# Patient Record
Sex: Female | Born: 1950
Health system: Southern US, Community
[De-identification: ages and names within clinical notes are randomized; demographics above are authoritative.]

## PROBLEM LIST (undated history)

## (undated) DIAGNOSIS — E669 Obesity, unspecified: Secondary | ICD-10-CM

## (undated) DIAGNOSIS — E785 Hyperlipidemia, unspecified: Secondary | ICD-10-CM

## (undated) DIAGNOSIS — N393 Stress incontinence (female) (male): Secondary | ICD-10-CM

## (undated) HISTORY — DX: Hyperlipidemia, unspecified: E78.5

## (undated) HISTORY — PX: CATARACT EXTRACTION: SUR2

## (undated) HISTORY — DX: Obesity, unspecified: E66.9

## (undated) HISTORY — DX: Stress incontinence (female) (male): N39.3

---

## 2003-09-30 ENCOUNTER — Encounter: Admission: RE | Admit: 2003-09-30 | Discharge: 2003-09-30 | Payer: Self-pay | Admitting: Family Medicine

## 2005-03-09 ENCOUNTER — Ambulatory Visit: Payer: Self-pay | Admitting: Family Medicine

## 2005-03-14 ENCOUNTER — Ambulatory Visit: Payer: Self-pay | Admitting: Family Medicine

## 2005-03-28 ENCOUNTER — Encounter: Admission: RE | Admit: 2005-03-28 | Discharge: 2005-03-28 | Payer: Self-pay | Admitting: Family Medicine

## 2008-10-28 ENCOUNTER — Emergency Department (HOSPITAL_COMMUNITY): Admission: EM | Admit: 2008-10-28 | Discharge: 2008-10-28 | Payer: Self-pay | Admitting: Family Medicine

## 2009-01-17 ENCOUNTER — Emergency Department (HOSPITAL_COMMUNITY): Admission: EM | Admit: 2009-01-17 | Discharge: 2009-01-17 | Payer: Self-pay | Admitting: Emergency Medicine

## 2009-08-23 ENCOUNTER — Ambulatory Visit: Payer: Self-pay | Admitting: Family Medicine

## 2010-07-21 ENCOUNTER — Emergency Department (HOSPITAL_COMMUNITY)
Admission: EM | Admit: 2010-07-21 | Discharge: 2010-07-21 | Payer: Self-pay | Source: Home / Self Care | Admitting: Family Medicine

## 2010-09-05 NOTE — Assessment & Plan Note (Signed)
Summary: WGT MGT CLASS WITH DR Oasis Goehring/[facility]   

## 2013-08-01 ENCOUNTER — Emergency Department (INDEPENDENT_AMBULATORY_CARE_PROVIDER_SITE_OTHER): Payer: 59

## 2013-08-01 ENCOUNTER — Emergency Department (HOSPITAL_COMMUNITY)
Admission: EM | Admit: 2013-08-01 | Discharge: 2013-08-01 | Disposition: A | Discharge status: Home / Self Care | Payer: 59 | Source: Home / Self Care | Attending: Emergency Medicine | Admitting: Emergency Medicine

## 2013-08-01 ENCOUNTER — Encounter (HOSPITAL_COMMUNITY): Payer: Self-pay | Admitting: Emergency Medicine

## 2013-08-01 DIAGNOSIS — R059 Cough, unspecified: Secondary | ICD-10-CM

## 2013-08-01 DIAGNOSIS — K2100 Gastro-esophageal reflux disease with esophagitis, without bleeding: Secondary | ICD-10-CM

## 2013-08-01 DIAGNOSIS — J45909 Unspecified asthma, uncomplicated: Secondary | ICD-10-CM

## 2013-08-01 DIAGNOSIS — I517 Cardiomegaly: Secondary | ICD-10-CM

## 2013-08-01 DIAGNOSIS — J323 Chronic sphenoidal sinusitis: Secondary | ICD-10-CM

## 2013-08-01 DIAGNOSIS — R05 Cough: Secondary | ICD-10-CM

## 2013-08-01 MED ORDER — AMOXICILLIN-POT CLAVULANATE 875-125 MG PO TABS: 1.0000 | ORAL_TABLET | Freq: Two times a day (BID) | ORAL | Status: DC

## 2013-08-01 MED ORDER — PREDNISONE 20 MG PO TABS: ORAL_TABLET | ORAL | Status: DC

## 2013-08-01 MED ORDER — HYDROCOD POLST-CHLORPHEN POLST 10-8 MG/5ML PO LQCR: 5.0000 mL | Freq: Two times a day (BID) | ORAL | Status: DC | PRN

## 2013-08-01 MED ORDER — OMEPRAZOLE 20 MG PO CPDR: 20.0000 mg | DELAYED_RELEASE_CAPSULE | Freq: Every day | ORAL | Status: DC

## 2013-08-01 MED ORDER — BUDESONIDE-FORMOTEROL FUMARATE 160-4.5 MCG/ACT IN AERO: 2.0000 | INHALATION_SPRAY | Freq: Two times a day (BID) | RESPIRATORY_TRACT | Status: DC

## 2013-08-01 NOTE — ED Notes (Signed)
Reports sinus infection.  Patient reports being treated for sinusitis, bronchitis in November.  Was treated with biaxin, mucinex and cough medicine.  Reports feeling better, but never seemed to lose the cough. Now phelgm is thick.  Phlegm  discolored in am ( green-brown), but thick and clear the remainder of the day.  Feels like "head is in a bucket and going to pop".  Reports cough is wearing her out

## 2013-08-01 NOTE — ED Provider Notes (Signed)
Chief Complaint:   Chief Complaint  Patient presents with  . URI    History of Present Illness:   Raven Campos is a 62 year old female who's had a 2 month history of a productive cough. This began in early November. She initially went to an urgent care in [place]. [place] and was given Biaxin and cough syrup which she took for about 10 days. The cough seemed to get better and almost completely went away for a while, but then recurred. The coughing is now severe and she has paroxysms of coughing. She produces small amounts of clear, white, and brown sputum. She has had some wheezing, chest tightness, and aching in her ribs when she coughs. She has headache and sinus pressure also since the beginning of November. She has clear rhinorrhea and bilateral ear congestion. She also has had sore throat, postnasal drip, and hoarseness. She's had intermittent indigestion and heartburn. She denies any history of asthma or COPD. She is a former smoker. She quit years ago. She has no history of allergies.  Review of Systems:  Other than noted above, the patient denies any of the following symptoms: Systemic:  No fevers, chills, sweats, weight loss or gain, or fatigue. ENT:  No nasal congestion, sneezing, itching, postnasal drip, sinus pressure, headache, sore throat, or hoarseness. Lungs:  No wheezing, shortness of breath, chest tightness or congestion. Heart:  No chest pain, tightness, pressure, PND, orthopnea, or ankle edema. GI:  No indigestion, heartburn, waterbrash, burping, abdominal pain, nausea, or vomiting.  PMFSH:  Past medical history, family history, social history, meds, and allergies were reviewed.  Specifically, there is no history of asthma, allergies. Her only medications are aspirin vitamin B12.  Physical Exam:   Vital signs:  BP 134/69  Pulse 100  Temp(Src) 98.4 F (36.9 C) (Oral)  Resp 20  SpO2 100%  LMP 08/01/2013 General:  Alert and oriented.  In no distress.  Skin warm and dry. ENT: TMs  and ear canals normal.  Nasal mucosa normal, without drainage.  Pharynx clear without exudate or drainage.  No intraoral lesions. Neck:  No adenopathy, tenderness or mass.  No JVD. Lungs:  No respiratory distress.  Breath sounds clear and equal bilaterally.  No wheezes, rales or rhonchi. Heart:  Regular rhythm, no gallops or murmers.  No pedal edema. Abdomon:  Soft and nontender.  No organomegaly or mass.  Radiology:  Dg Sinuses Complete  08/01/2013   CLINICAL DATA:  Cough and congestion, cough for 2 months  EXAM: PARANASAL SINUSES - COMPLETE 3 + VIEW  COMPARISON:  None  FINDINGS: Nasal septum midline.  Paranasal sinuses clear.  Osseous mineralization normal.  On the SMV view, question tiny fluid level within the sphenoid sinus.  No other fluid levels identified.  Bones unremarkable.  IMPRESSION: Question tiny fluid level within the sphenoid sinus.  Otherwise negative exam.   Electronically Signed   By: Ulyses Southward M.D.   On: 08/01/2013 18:44   Dg Chest 2 View  08/01/2013   CLINICAL DATA:  Cough and congestion for 1 month  EXAM: CHEST  2 VIEW  COMPARISON:  None  FINDINGS: Enlargement of cardiac silhouette.  Tortuous aorta.  Mediastinal contours and pulmonary vascularity normal.  Lungs clear.  No pleural effusion or pneumothorax.  Bones unremarkable.  IMPRESSION: Enlargement of cardiac silhouette.  No acute abnormalities.   Electronically Signed   By: Ulyses Southward M.D.   On: 08/01/2013 18:39   Assessment:  The primary encounter diagnosis was Cough. Diagnoses  of Sphenoid sinusitis, Reactive airway disease, Reflux esophagitis, and Cardiomegaly were also pertinent to this visit.  I think her cough is multifactorial. Some of this due to sinusitis and postnasal drip, some to reactive airways disease, and partially to reflux esophagitis. She'll need therapy directed at all of these. Recommended she followup with her primary care physician in about 2 weeks. She also had mild cardiomegaly on chest x-ray. I am  not very impressed with this, but since it was mentioned the x-ray report, I suggested she followup with a cardiologist. Her husband sees Dr. Sherilyn Banker.  Plan:   1.  Meds:  The following meds were prescribed:   Discharge Medication List as of 08/01/2013  6:59 PM    START taking these medications   Details  amoxicillin-clavulanate (AUGMENTIN) 875-125 MG per tablet Take 1 tablet by mouth 2 (two) times daily., Starting 08/01/2013, Until Discontinued, Normal    budesonide-formoterol (SYMBICORT) 160-4.5 MCG/ACT inhaler Inhale 2 puffs into the lungs 2 (two) times daily., Starting 08/01/2013, Until Discontinued, Normal    chlorpheniramine-HYDROcodone (TUSSIONEX) 10-8 MG/5ML LQCR Take 5 mLs by mouth every 12 (twelve) hours as needed for cough., Starting 08/01/2013, Until Discontinued, Normal    omeprazole (PRILOSEC) 20 MG capsule Take 1 capsule (20 mg total) by mouth daily., Starting 08/01/2013, Until Discontinued, Normal    predniSONE (DELTASONE) 20 MG tablet 3 daily for 5 days, 2 daily for 5 days, 1 daily for 5 days., Normal        2.  Patient Education/Counseling:  The patient was given appropriate handouts, self care instructions, and instructed in symptomatic relief.  3.  Follow up:  The patient was told to follow up if no better in 3 to 4 days, if becoming worse in any way, and given some red flag symptoms such as chest pain or difficulty breathing which would prompt immediate return.  Follow up with her primary care physician in about 2 weeks and with Dr. Nadara Eaton with regard to the cardiomegaly.       Reuben Likes, MD 08/01/13 [id number]

## 2014-07-19 ENCOUNTER — Ambulatory Visit: Payer: 59 | Admitting: Dietician

## 2014-08-19 ENCOUNTER — Encounter: Payer: 59 | Attending: Family Medicine | Admitting: Dietician

## 2014-08-19 ENCOUNTER — Encounter: Payer: Self-pay | Admitting: Dietician

## 2014-08-19 VITALS — Ht 59.0 in | Wt 201.7 lb

## 2014-08-19 DIAGNOSIS — Z6841 Body Mass Index (BMI) 40.0 and over, adult: Secondary | ICD-10-CM | POA: Insufficient documentation

## 2014-08-19 DIAGNOSIS — E669 Obesity, unspecified: Secondary | ICD-10-CM | POA: Insufficient documentation

## 2014-08-19 NOTE — Progress Notes (Signed)
  Medical Nutrition Therapy:  Appt start time: 1045 end time:  1130.   Assessment:  Primary concerns today: Raven Campos states that she is here today to discuss overall healthy diet. Her husband has high TGs and prediabetes. They have recently made some dietary changes. They try to avoid red meat and use olive oil when cooking and are trying to eat more fresh fruit and frozen vegetables. Also limiting sodium. Raven Campos states she wants to learn how to read a food label.    Preferred Learning Style:   No preference indicated   Learning Readiness:   Ready  Change in progress   MEDICATIONS: see list  Usual physical activity: none  Estimated energy needs: 1500-1600 calories  Progress Towards Goal(s):  In progress.   Nutritional Diagnosis:  [place]-3.3 Overweight/obesity As related to history of excessive energy intake.  As evidenced by BMI 40.8.    Intervention:  Nutrition counseling provided. Praised patient on lifestyle changes she has made so far. Discussed low sodium seasoning options and brainstormed different meal ideas.  Teaching Method Utilized:  Visual Auditory  Handouts given during visit include:  MyPlate  15g CHO + protein snacks  Bake, broil, grill  Low sodium flavoring tips  Cholesterol and Triglycerides  Meal planning card  Barriers to learning/adherence to lifestyle change: stress  Demonstrated degree of understanding via:  Teach Back   Monitoring/Evaluation:  Dietary intake, exercise, and body weight prn.

## 2015-01-10 ENCOUNTER — Ambulatory Visit (INDEPENDENT_AMBULATORY_CARE_PROVIDER_SITE_OTHER): Payer: 59 | Admitting: Primary Care

## 2015-01-10 ENCOUNTER — Encounter: Payer: Self-pay | Admitting: Primary Care

## 2015-01-10 VITALS — BP 132/80 | HR 83 | Temp 97.8°F | Ht 60.5 in | Wt 202.8 lb

## 2015-01-10 DIAGNOSIS — Z91038 Other insect allergy status: Secondary | ICD-10-CM | POA: Diagnosis not present

## 2015-01-10 DIAGNOSIS — R200 Anesthesia of skin: Secondary | ICD-10-CM | POA: Insufficient documentation

## 2015-01-10 DIAGNOSIS — N3946 Mixed incontinence: Secondary | ICD-10-CM | POA: Diagnosis not present

## 2015-01-10 DIAGNOSIS — Z91030 Bee allergy status: Secondary | ICD-10-CM

## 2015-01-10 DIAGNOSIS — E785 Hyperlipidemia, unspecified: Secondary | ICD-10-CM | POA: Diagnosis not present

## 2015-01-10 DIAGNOSIS — R32 Unspecified urinary incontinence: Secondary | ICD-10-CM | POA: Insufficient documentation

## 2015-01-10 HISTORY — DX: Anesthesia of skin: R20.0

## 2015-01-10 MED ORDER — EPINEPHRINE 0.3 MG/0.3ML IJ SOAJ
0.3000 mg | Freq: Once | INTRAMUSCULAR | Status: DC
Start: 2015-01-10 — End: 2019-09-22

## 2015-01-10 NOTE — Assessment & Plan Note (Signed)
Mixed stress and urge. Present since abdominal weight gain years ago. Offered medication. Advised weight loss. She will work to reduce her weight at this time. Will continue to monitor.

## 2015-01-10 NOTE — Assessment & Plan Note (Addendum)
Prior history. Once taking Fish Oil but has not since 2009. Will check lipids with upcoming physical. She recently met with a dietician who provided education regarding diet. Encouraged her new eating habits and instructed to start exercising.

## 2015-01-10 NOTE — Progress Notes (Signed)
Subjective:    Patient ID: Raven Campos, female    DOB: Oct 25, 1950, 64 y.o.   MRN: [id number]  HPI  Raven Campos is a 64 year old female who presents today who presents today to establish care and discuss the problems mentioned below.   1) Hyperlipidemia: Diagnosed several years ago and took [employer] for years. She was never placed on medication and has been off of Fish Oil since 2009. She presented to the dietician through [facility] early this year for consultation. Her new diet consists of grilled, broiled lean meats, vegetables, limited starchy foods. She drinks mostly water, coffee, and [facility]. She is a stress eater and will eat large portions at times. She is currently not exercising.  2) Urine Incontinence: Stress incontinence. Present since abdominal weight gain over the past several years. History of frequent UTI's as a child. Occurs daily and wears a pad throughout the day. She is   3) Numbness: Present to fingers for the past year intermittently. She will notice it mostly when sitting or driving in the car. When standing up straight and improving her posture her numbness will improve.  Review of Systems  Constitutional: Negative for unexpected weight change.  HENT: Negative for rhinorrhea.   Respiratory: Negative for cough and shortness of breath.   Cardiovascular: Negative for chest pain.  Gastrointestinal: Negative for diarrhea and constipation.  Genitourinary:       Stress incontinence   Skin: Negative for rash.  Allergic/Immunologic: Positive for environmental allergies.  Neurological: Negative for dizziness and headaches.  Psychiatric/Behavioral:       Denies concerns for anxiety or depression.       Past Medical History  Diagnosis Date  . Obesity   . Hyperlipidemia   . Stress incontinence     History   Social History  . Marital Status: Married    Spouse Name: N/A  . Number of Children: N/A  . Years of Education: N/A   Occupational History  .  Not on file.   Social History Main Topics  . Smoking status: Never Smoker   . Smokeless tobacco: Not on file  . Alcohol Use: 0.0 oz/week    0 Standard drinks or equivalent per week     Comment: occ  . Drug Use: No  . Sexual Activity: Not on file   Other Topics Concern  . Not on file   Social History Narrative   Married.   Highest level of education: Associates degree in Nursing.   Works at [employer].   Enjoys gardening, yard work, sewing, working puzzles.    No past surgical history on file.  Family History  Problem Relation Age of Onset  . Cancer Other   . Obesity Other   . Hypertension Other   . Arthritis Mother   . Hyperlipidemia Mother   . Hypertension Mother   . Cancer Father     lung    Allergies  Allergen Reactions  . Bee Venom     Current Outpatient Prescriptions on File Prior to Visit  Medication Sig Dispense Refill  . Cyanocobalamin (VITAMIN B 12 PO) Take by mouth.     No current facility-administered medications on file prior to visit.    BP 132/80 mmHg  Pulse 83  Temp(Src) 97.8 F (36.6 C) (Oral)  Ht 5' 0.5" (1.537 m)  Wt 202 lb 12.8 oz (91.989 kg)  BMI 38.94 kg/m2  SpO2 95%  LMP 08/01/2013    Objective:  Physical Exam  Constitutional: She is oriented to person, place, and time. She appears well-nourished.  Cardiovascular: Normal rate and regular rhythm.   Pulmonary/Chest: Effort normal and breath sounds normal.  Musculoskeletal: Normal range of motion.  Negative phalens and tinnels sign.  Neurological: She is alert and oriented to person, place, and time.  Skin: Skin is warm and dry.  Psychiatric: She has a normal mood and affect.          Assessment & Plan:

## 2015-01-10 NOTE — Progress Notes (Signed)
Pre visit review using our clinic review tool, if applicable. No additional management support is needed unless otherwise documented below in the visit note. 

## 2015-01-10 NOTE — Patient Instructions (Signed)
I have sent you an Epi Pen with refills to your pharmacy. Please schedule a physical with me in the next 1-2 months. You will also schedule a lab only appointment one week prior. We will discuss your lab results during your physical. It was a pleasure to meet you today! Please don't hesitate to call me with any questions. Welcome to [facility]!

## 2015-01-10 NOTE — Assessment & Plan Note (Signed)
Present intermittently to fingers only. Improved with correct posture.  Will continue to monitor.

## 2015-01-11 ENCOUNTER — Other Ambulatory Visit: Payer: Self-pay

## 2015-01-11 DIAGNOSIS — Z1231 Encounter for screening mammogram for malignant neoplasm of breast: Secondary | ICD-10-CM

## 2015-02-01 ENCOUNTER — Ambulatory Visit
Admission: RE | Admit: 2015-02-01 | Discharge: 2015-02-01 | Disposition: A | Discharge status: Home / Self Care | Payer: 59 | Source: Ambulatory Visit

## 2015-02-01 DIAGNOSIS — Z1231 Encounter for screening mammogram for malignant neoplasm of breast: Secondary | ICD-10-CM

## 2015-02-08 LAB — HM MAMMOGRAPHY: HM Mammogram: NORMAL

## 2015-02-09 ENCOUNTER — Encounter: Payer: Self-pay | Admitting: *Deleted

## 2015-02-17 ENCOUNTER — Other Ambulatory Visit: Payer: Self-pay | Admitting: Primary Care

## 2015-02-17 DIAGNOSIS — Z0000 Encounter for general adult medical examination without abnormal findings: Secondary | ICD-10-CM

## 2015-02-17 DIAGNOSIS — E785 Hyperlipidemia, unspecified: Secondary | ICD-10-CM

## 2015-02-22 ENCOUNTER — Other Ambulatory Visit (INDEPENDENT_AMBULATORY_CARE_PROVIDER_SITE_OTHER): Payer: 59

## 2015-02-22 DIAGNOSIS — Z0000 Encounter for general adult medical examination without abnormal findings: Secondary | ICD-10-CM

## 2015-02-22 DIAGNOSIS — E785 Hyperlipidemia, unspecified: Secondary | ICD-10-CM | POA: Diagnosis not present

## 2015-02-22 LAB — LIPID PANEL
Cholesterol: 206 mg/dL — ABNORMAL HIGH (ref 0–200)
HDL: 47.1 mg/dL (ref >39.00)
LDL Cholesterol: 132 mg/dL — ABNORMAL HIGH (ref 0–99)
NONHDL: 158.9
TRIGLYCERIDES: 136 mg/dL (ref 0.0–149.0)
Total CHOL/HDL Ratio: 4
VLDL: 27.2 mg/dL (ref 0.0–40.0)

## 2015-02-22 LAB — COMPREHENSIVE METABOLIC PANEL
ALBUMIN: 4 g/dL (ref 3.5–5.2)
ALT: 31 U/L (ref 0–35)
AST: 23 U/L (ref 0–37)
Alkaline Phosphatase: 58 U/L (ref 39–117)
BILIRUBIN TOTAL: 0.5 mg/dL (ref 0.2–1.2)
BUN: 19 mg/dL (ref 6–23)
CHLORIDE: 106 meq/L (ref 96–112)
CO2: 29 meq/L (ref 19–32)
CREATININE: 0.65 mg/dL (ref 0.40–1.20)
Calcium: 9.1 mg/dL (ref 8.4–10.5)
GFR: 97.9 mL/min (ref >60.00)
GLUCOSE: 94 mg/dL (ref 70–99)
Potassium: 4.3 mEq/L (ref 3.5–5.1)
Sodium: 142 mEq/L (ref 135–145)
TOTAL PROTEIN: 6.7 g/dL (ref 6.0–8.3)

## 2015-02-22 LAB — CBC
HEMATOCRIT: 42.9 % (ref 36.0–46.0)
HEMOGLOBIN: 14.6 g/dL (ref 12.0–15.0)
MCHC: 34 g/dL (ref 30.0–36.0)
MCV: 89.6 fl (ref 78.0–100.0)
PLATELETS: 210 10*3/uL (ref 150.0–400.0)
RBC: 4.79 Mil/uL (ref 3.87–5.11)
RDW: 13.1 % (ref 11.5–15.5)
WBC: 5.8 10*3/uL (ref 4.0–10.5)

## 2015-02-22 LAB — TSH: TSH: 1.54 u[IU]/mL (ref 0.35–4.50)

## 2015-02-22 LAB — VITAMIN D 25 HYDROXY (VIT D DEFICIENCY, FRACTURES): VITD: 17.62 ng/mL — ABNORMAL LOW (ref 30.00–100.00)

## 2015-02-22 LAB — HEMOGLOBIN A1C: HEMOGLOBIN A1C: 5.7 % (ref 4.6–6.5)

## 2015-03-01 ENCOUNTER — Ambulatory Visit (INDEPENDENT_AMBULATORY_CARE_PROVIDER_SITE_OTHER): Payer: 59 | Admitting: Primary Care

## 2015-03-01 ENCOUNTER — Other Ambulatory Visit (HOSPITAL_COMMUNITY)
Admission: RE | Admit: 2015-03-01 | Discharge: 2015-03-01 | Disposition: A | Discharge status: Home / Self Care | Payer: 59 | Source: Ambulatory Visit | Attending: Primary Care | Admitting: Primary Care

## 2015-03-01 ENCOUNTER — Encounter: Payer: Self-pay | Admitting: Primary Care

## 2015-03-01 VITALS — BP 120/86 | HR 65 | Temp 97.5°F | Ht 62.0 in | Wt 200.8 lb

## 2015-03-01 DIAGNOSIS — Z124 Encounter for screening for malignant neoplasm of cervix: Secondary | ICD-10-CM | POA: Diagnosis not present

## 2015-03-01 DIAGNOSIS — Z0000 Encounter for general adult medical examination without abnormal findings: Secondary | ICD-10-CM | POA: Insufficient documentation

## 2015-03-01 DIAGNOSIS — E559 Vitamin D deficiency, unspecified: Secondary | ICD-10-CM | POA: Diagnosis not present

## 2015-03-01 DIAGNOSIS — Z1151 Encounter for screening for human papillomavirus (HPV): Secondary | ICD-10-CM | POA: Insufficient documentation

## 2015-03-01 DIAGNOSIS — Z01419 Encounter for gynecological examination (general) (routine) without abnormal findings: Secondary | ICD-10-CM | POA: Diagnosis not present

## 2015-03-01 DIAGNOSIS — Z23 Encounter for immunization: Secondary | ICD-10-CM | POA: Diagnosis not present

## 2015-03-01 DIAGNOSIS — E785 Hyperlipidemia, unspecified: Secondary | ICD-10-CM

## 2015-03-01 MED ORDER — VITAMIN D (ERGOCALCIFEROL) 1.25 MG (50000 UNIT) PO CAPS: ORAL_CAPSULE | ORAL | Status: DC

## 2015-03-01 NOTE — Patient Instructions (Addendum)
You will be contacted regarding [facility] .  Please let us know if you have not heard back within one week.   Start Vitamin D 50,0000 unit capsules. Take 1 capsule by mouth once weekly for a total of 12 weeks.  Work to [employer] and start exercising.  Follow up in 6 months for re-evaluation of your cholesterol and diabetes test.  It was nice to see you!

## 2015-03-01 NOTE — Assessment & Plan Note (Signed)
Vitamin D level at 17. Initiated 50,000 unit dose to take 1 weekly. Bone density testing ordered. Will recheck in 12 weeks.

## 2015-03-01 NOTE — Progress Notes (Signed)
Subjective:    Patient ID: Raven Campos, female    DOB: May 19, 1951, 64 y.o.   MRN: [id number]  HPI  Raven Campos is a 64 year old female who presents today for complete physical.  Immunizations: -Tetanus: Tetanus Booster was completed at [facility]. -Influenza: Did receive last season. -Shingles: Has never completed. Due.   Diet: Fair diet. Breakfast: Oatmeal, special K cereal, cherrios. Occasional Ham biscuit. Lunch: Salad, chicken, pasta, left over dinner, peanut butter cracker Dinner: Red meat, chicken, fish, pork chops, sweet potatoes, vegetables from her garden Desserts: Rare, chocolate chip cookies. Beverages: Drinking mostly soft drinks, some water. Exercise: She is not exercising regularly. Active around the house.  Eye exam: Completes every 2 years. Completed last July, wears glasses to drive. Dental exam: Due in September this year. Completes every 6 months. Colonoscopy: Never completed, she is not interested in an actual colonoscopy but is interested in the FOBT kit. Dexa: Last completed several years ago. Pap Smear: Has not completed in over 10 years. Mammogram: Completed mammogram July 2016.   Review of Systems  Constitutional: Negative for unexpected weight change.  HENT: Negative for rhinorrhea.   Respiratory: Negative for cough and shortness of breath.   Cardiovascular: Negative for chest pain.  Gastrointestinal: Negative for abdominal pain, diarrhea and constipation.  Genitourinary: Negative for difficulty urinating.       Urinary frequency. Wears incontinence pad for some stress incontinence. Has had urological testing, no abnormalities.   Musculoskeletal: Negative for myalgias and arthralgias.  Skin: Negative for rash.  Neurological: Negative for dizziness, numbness and headaches.  Psychiatric/Behavioral:       Denies concerns for anxiety and depression.       Past Medical History  Diagnosis Date  . Obesity   . Hyperlipidemia   . Stress incontinence      History   Social History  . Marital Status: Married    Spouse Name: N/A  . Number of Children: N/A  . Years of Education: N/A   Occupational History  . Not on file.   Social History Main Topics  . Smoking status: Never Smoker   . Smokeless tobacco: Not on file  . Alcohol Use: 0.0 oz/week    0 Standard drinks or equivalent per week     Comment: occ  . Drug Use: No  . Sexual Activity: Not on file   Other Topics Concern  . Not on file   Social History Narrative   Married.   Highest level of education: Associates degree in Nursing.   Works at [employer].   Enjoys gardening, yard work, sewing, working puzzles.    No past surgical history on file.  Family History  Problem Relation Age of Onset  . Cancer Other   . Obesity Other   . Hypertension Other   . Arthritis Mother   . Hyperlipidemia Mother   . Hypertension Mother   . Cancer Father     lung    Allergies  Allergen Reactions  . Bee Venom     Current Outpatient Prescriptions on File Prior to Visit  Medication Sig Dispense Refill  . aspirin 81 MG tablet Take 81 mg by mouth daily.    . Cyanocobalamin (VITAMIN B 12 PO) Take by mouth.    . EPINEPHrine (EPIPEN 2-PAK) 0.3 mg/0.3 mL IJ SOAJ injection Inject 0.3 mLs (0.3 mg total) into the muscle once. (Patient not taking: Reported on 03/01/2015) 1 Device 1   No current facility-administered medications on file  prior to visit.    BP 120/86 mmHg  Pulse 65  Temp(Src) 97.5 F (36.4 C) (Oral)  Ht '5\' 2"'  (1.575 m)  Wt 200 lb 12.8 oz (91.082 kg)  BMI 36.72 kg/m2  SpO2 97%  LMP 08/01/2013     Objective:   Physical Exam  Constitutional: She is oriented to person, place, and time. She appears well-nourished.  HENT:  Right Ear: Tympanic membrane and ear canal normal.  Left Ear: Tympanic membrane and ear canal normal.  Nose: Nose normal.  Mouth/Throat: Oropharynx is clear and moist.  Eyes: Conjunctivae and EOM are normal. Pupils are  equal, round, and reactive to light.  Neck: Neck supple. No thyromegaly present.  Cardiovascular: Normal rate and regular rhythm.   Pulmonary/Chest: Effort normal and breath sounds normal.  Abdominal: Soft. Bowel sounds are normal. There is no tenderness.  Genitourinary: Vagina normal. Cervix exhibits no motion tenderness and no discharge. Right adnexum displays no tenderness. Left adnexum displays no tenderness.  Musculoskeletal: Normal range of motion.  Lymphadenopathy:    She has no cervical adenopathy.  Neurological: She is alert and oriented to person, place, and time. She has normal reflexes. No cranial nerve deficit.  Skin: Skin is warm and dry.  Psychiatric: She has a normal mood and affect.          Assessment & Plan:

## 2015-03-01 NOTE — Assessment & Plan Note (Addendum)
Due for shingles. Tetanus up to date. Pap due and completed today. Will order bone density screening. Labs mostly unremarkable with exception to slightly above goal cholesterol and near borderline diabetes. Discussed healthy diet and exercise. Will recheck in 6 months. Exam unremarkable.

## 2015-03-01 NOTE — Assessment & Plan Note (Signed)
Slightly above goal. She will work hard on diet and exercise. Recheck in 6 months.

## 2015-03-01 NOTE — Progress Notes (Signed)
Pre visit review using our clinic review tool, if applicable. No additional management support is needed unless otherwise documented below in the visit note. 

## 2015-03-02 LAB — CYTOLOGY - PAP

## 2015-03-03 ENCOUNTER — Encounter: Payer: Self-pay | Admitting: *Deleted

## 2015-03-15 ENCOUNTER — Ambulatory Visit
Admission: RE | Admit: 2015-03-15 | Discharge: 2015-03-15 | Disposition: A | Discharge status: Home / Self Care | Payer: 59 | Source: Ambulatory Visit | Attending: Primary Care | Admitting: Primary Care

## 2015-03-15 DIAGNOSIS — E559 Vitamin D deficiency, unspecified: Secondary | ICD-10-CM

## 2015-05-27 ENCOUNTER — Other Ambulatory Visit (INDEPENDENT_AMBULATORY_CARE_PROVIDER_SITE_OTHER): Payer: 59

## 2015-05-27 DIAGNOSIS — E559 Vitamin D deficiency, unspecified: Secondary | ICD-10-CM

## 2015-05-27 LAB — VITAMIN D 25 HYDROXY (VIT D DEFICIENCY, FRACTURES): VITD: 33.3 ng/mL (ref 30.00–100.00)

## 2015-06-26 ENCOUNTER — Other Ambulatory Visit: Payer: Self-pay | Admitting: Primary Care

## 2015-09-01 ENCOUNTER — Encounter: Payer: Self-pay | Admitting: Primary Care

## 2015-09-01 ENCOUNTER — Ambulatory Visit (INDEPENDENT_AMBULATORY_CARE_PROVIDER_SITE_OTHER): Payer: 59 | Admitting: Primary Care

## 2015-09-01 VITALS — BP 118/78 | HR 64 | Temp 98.1°F | Ht 62.0 in | Wt 200.8 lb

## 2015-09-01 DIAGNOSIS — E785 Hyperlipidemia, unspecified: Secondary | ICD-10-CM | POA: Diagnosis not present

## 2015-09-01 DIAGNOSIS — E559 Vitamin D deficiency, unspecified: Secondary | ICD-10-CM

## 2015-09-01 DIAGNOSIS — Z1211 Encounter for screening for malignant neoplasm of colon: Secondary | ICD-10-CM

## 2015-09-01 LAB — VITAMIN D 25 HYDROXY (VIT D DEFICIENCY, FRACTURES): VITD: 17.54 ng/mL — AB (ref 30.00–100.00)

## 2015-09-01 NOTE — Addendum Note (Signed)
Addended by: Kilian Schwartz J on: 07/11/2016 10:35 AM   Modules accepted: Orders  

## 2015-09-01 NOTE — Progress Notes (Signed)
Pre visit review using our clinic review tool, if applicable. No additional management support is needed unless otherwise documented below in the visit note. 

## 2015-09-01 NOTE — Assessment & Plan Note (Signed)
Recent level improved to 33 from 17. Currently managed on 2000 units daily. Continue same, vitamin D level pending.

## 2015-09-01 NOTE — Patient Instructions (Signed)
Complete lab work prior to leaving today. I will notify you of your results once received.   Pick up your stool kit from the lab.  Follow up in late July or early August 2017 for your annual physical. Ensure to schedule a lab appointment 3-4 days prior.  Continue your efforts towards a healthy lifestyle.  It was a pleasure to see you today!

## 2015-09-01 NOTE — Progress Notes (Signed)
Subjective:    Patient ID: Raven Campos, female    DOB: [birth year], 65 y.o.   MRN: [id number]  HPI  Raven Campos is a 65 year old female who presents today for follow up.   1) Hyperlipidemia: TC of 206 and LDL of 132 during labs in July 2016. She's been working to make changes in her diet since her last visit. Decreased mountain dew consumption, cut out sweet tea, eating little to no fried foods.   Her diet currently consists of: Breakfast: Oatmeal, cereal, 2% milk, occasional ham biscuit and omlette Lunch: Home packed, soup, fruit, peanut butter sandwich, salad, flat bread sub sandwich. Dinner: Home cooked meals. (fresh vegetables, lean meats-chicken, pork, fish)  Snacks: Peanut butter and crackers, fruit Desserts: Occasional candy bar or cookie Beverages: Water, down to 2 mountain dew's per day, no sweet tea since summer 2016.  Exercise: She is not exercising, but plans on starting.  Wt Readings from Last 3 Encounters:  09/01/15 200 lb 12.8 oz (91.082 kg)  03/01/15 200 lb 12.8 oz (91.082 kg)  01/10/15 202 lb 12.8 oz (91.989 kg)     2) Vitamin D Deficiency: Original level of 17.6 in July 2016, placed on 50,000 units weekly x 3 months, repeat level of 33 in October. She was to transition to 2000 units daily. She has been taking 2000 units daily since October.   Review of Systems  Respiratory: Negative for shortness of breath.   Cardiovascular: Negative for chest pain.  Musculoskeletal: Negative for myalgias.  Neurological: Negative for headaches.       Past Medical History  Diagnosis Date  . Obesity   . Hyperlipidemia   . Stress incontinence     Social History   Social History  . Marital Status: Married    Spouse Name: N/A  . Number of Children: N/A  . Years of Education: N/A   Occupational History  . Not on file.   Social History Main Topics  . Smoking status: Never Smoker   . Smokeless tobacco: Not on file  . Alcohol Use: 0.0 oz/week    0 Standard  drinks or equivalent per week     Comment: occ  . Drug Use: No  . Sexual Activity: Not on file   Other Topics Concern  . Not on file   Social History Narrative   Married.   Highest level of education: Associates degree in Nursing.   Works at [employer].   Enjoys gardening, yard work, sewing, working puzzles.    No past surgical history on file.  Family History  Problem Relation Age of Onset  . Cancer Other   . Obesity Other   . Hypertension Other   . Arthritis Mother   . Hyperlipidemia Mother   . Hypertension Mother   . Cancer Father     lung    Allergies  Allergen Reactions  . Bee Venom     Current Outpatient Prescriptions on File Prior to Visit  Medication Sig Dispense Refill  . aspirin 81 MG tablet Take 81 mg by mouth daily.    . Cyanocobalamin (VITAMIN B 12 PO) Take by mouth.    . Vitamin D, Ergocalciferol, (DRISDOL) 50000 UNITS CAPS capsule Take 1 capsule by mouth once weekly. 4 capsule 3  . EPINEPHrine (EPIPEN 2-PAK) 0.3 mg/0.3 mL IJ SOAJ injection Inject 0.3 mLs (0.3 mg total) into the muscle once. (Patient not taking: Reported on 03/01/2015) 1 Device 1   No current facility-administered medications  on file prior to visit.    BP 118/78 mmHg  Pulse 64  Temp(Src) 98.1 F (36.7 C) (Oral)  Ht  (1.575 m)  Wt 200 lb 12.8 oz (91.082 kg)  BMI 36.72 kg/m2  SpO2 96%  LMP 08/01/2013    Objective:   Physical Exam  Constitutional: She appears well-nourished.  Cardiovascular: Normal rate and regular rhythm.   Pulmonary/Chest: Effort normal and breath sounds normal.  Skin: Skin is warm and dry.          Assessment & Plan:

## 2015-09-01 NOTE — Assessment & Plan Note (Addendum)
Working to make changes in lifestyle through improvements in her diet. Encouraged her to continue good work.  Weight stable.  Will recheck lipids during physical this summer.

## 2015-12-14 ENCOUNTER — Encounter: Payer: Self-pay | Admitting: Primary Care

## 2015-12-15 ENCOUNTER — Ambulatory Visit (INDEPENDENT_AMBULATORY_CARE_PROVIDER_SITE_OTHER)
Admission: RE | Admit: 2015-12-15 | Discharge: 2015-12-15 | Disposition: A | Discharge status: Home / Self Care | Payer: 59 | Source: Ambulatory Visit | Attending: Family Medicine | Admitting: Family Medicine

## 2015-12-15 ENCOUNTER — Ambulatory Visit (INDEPENDENT_AMBULATORY_CARE_PROVIDER_SITE_OTHER): Payer: 59 | Admitting: Family Medicine

## 2015-12-15 ENCOUNTER — Encounter: Payer: Self-pay | Admitting: Family Medicine

## 2015-12-15 VITALS — BP 140/82 | HR 73 | Temp 98.4°F | Ht 62.0 in | Wt 201.5 lb

## 2015-12-15 DIAGNOSIS — M25562 Pain in left knee: Secondary | ICD-10-CM | POA: Diagnosis not present

## 2015-12-15 DIAGNOSIS — M84469A Pathological fracture, unspecified tibia and fibula, initial encounter for fracture: Secondary | ICD-10-CM

## 2015-12-15 DIAGNOSIS — S83242A Other tear of medial meniscus, current injury, left knee, initial encounter: Secondary | ICD-10-CM | POA: Diagnosis not present

## 2015-12-15 DIAGNOSIS — M25462 Effusion, left knee: Secondary | ICD-10-CM | POA: Diagnosis not present

## 2015-12-15 NOTE — Progress Notes (Signed)
Dr. Karleen Hampshire T. Kwabena Strutz, MD, CAQ [facility] [address] [place] [place], [address] Phone: [phone] Fax: [phone]  12/15/2015  Patient: Raven Campos, MRN: [id number], DOB: 1950-08-24, 65 y.o.  Primary Physician:  Morrie Sheldon, NP   Chief Complaint  Patient presents with  . Knee Pain    Left x 3 weeks   Subjective:   Raven Campos is a 65 y.o. very pleasant female patient who presents with the following:  About three weeks ago, planting her flower garden, cleaning gutters. All in 1 weekend. She denies having any specific injury that she can recall.  She did do a tremendous amount of yard work all on one weekend, and since then she has been having dramatic pain.  She is very significantly limping and has having a hard time walking.  Her pain is all medial.  Over-the-counter remedies and ice is not really helped significantly at all.  40 years ago, cleated some on the L knee.   Past Medical History, Surgical History, Social History, Family History, Problem List, Medications, and Allergies have been reviewed and updated if relevant.  Patient Active Problem List   Diagnosis Date Noted  . Preventative health care 03/01/2015  . Vitamin D deficiency 03/01/2015  . Hyperlipidemia 01/10/2015  . Urine incontinence 01/10/2015  . Numbness 01/10/2015    Past Medical History  Diagnosis Date  . Obesity   . Hyperlipidemia   . Stress incontinence     No past surgical history on file.  Social History   Social History  . Marital Status: Married    Spouse Name: N/A  . Number of Children: N/A  . Years of Education: N/A   Occupational History  . Not on file.   Social History Main Topics  . Smoking status: Never Smoker   . Smokeless tobacco: Never Used  . Alcohol Use: 0.0 oz/week    0 Standard drinks or equivalent per week     Comment: occ  . Drug Use: No  . Sexual Activity: Not on file   Other Topics Concern  . Not on  file   Social History Narrative   Married.   Highest level of education: Associates degree in Nursing.   Works at [employer].   Enjoys gardening, yard work, sewing, working puzzles.    Family History  Problem Relation Age of Onset  . Cancer Other   . Obesity Other   . Hypertension Other   . Arthritis Mother   . Hyperlipidemia Mother   . Hypertension Mother   . Cancer Father     lung    Allergies  Allergen Reactions  . Bee Venom     Medication list reviewed and updated in full in  Link.  GEN: No fevers, chills. Nontoxic. Primarily MSK c/o today. MSK: Detailed in the HPI GI: tolerating PO intake without difficulty Neuro: No numbness, parasthesias, or tingling associated. Otherwise the pertinent positives of the ROS are noted above.   Objective:   BP 140/82 mmHg  Pulse 73  Temp(Src) 98.4 F (36.9 C) (Oral)  Ht 5\' 2"  (1.575 m)  Wt 201 lb 8 oz (91.4 kg)  BMI 36.85 kg/m2  LMP 08/01/2013   GEN: WDWN, NAD, Non-toxic, Alert & Oriented x 3 HEENT: Atraumatic, Normocephalic.  Ears and Nose: No external deformity. EXTR: No clubbing/cyanosis/edema NEURO: markedly antalgic gait  PSYCH: Normally interactive. Conversant. Not depressed or anxious appearing.  Calm demeanor.  Examination is limited somewhat secondary to pain.  Left knee lacks a few degrees of extension.  She is only able to flex her knee approximately 25.  Nontender at the patellar tendon or quadriceps tendon.  Nontender at the patella.  Markedly tender just inferior to the medial condyle.  This is the point of maximal tenderness, and markedly tender with minimal pressure.  She also has significant tenderness on her joint line medially.  Lateral joint line is nontender.  MCL and LCL appear grossly stable, and unable to fully assess ACL and PCL.  Radiology: Mr Knee Left  Wo Contrast  12/16/2015  CLINICAL DATA:  Severe medial left knee pain for 3 weeks. No known injury.  Initial encounter. EXAM: MRI OF THE LEFT KNEE WITHOUT CONTRAST TECHNIQUE: Multiplanar, multisequence MR imaging of the knee was performed. No intravenous contrast was administered. COMPARISON:  Plain films left knee 12/15/2015. FINDINGS: MENISCI Medial meniscus: A large radial tear is seen at and just peripheral to the root of the posterior horn of the medial meniscus. The body of the medial meniscus is extruded peripherally. No centrally displaced fragment. Lateral meniscus:  Intact. LIGAMENTS Cruciates:  Intact. Collaterals:  Intact. CARTILAGE Patellofemoral: Thinned throughout with mild subchondral edema seen at the apex of the patella in the superior pole and in the medial facet in the midpole. Medial:  Mildly degenerated. Lateral:  Unremarkable. Joint:  Small joint effusion. Popliteal Fossa: Baker's cyst containing a few small loose bodies measures 1.9 cm AP by 0.9 cm transverse by 5.3 cm craniocaudal. Extensor Mechanism:  Intact. Bones: The patient has an incomplete and nondisplaced insufficiency fracture in the medial tibial metaphysis with associated marrow edema. Bone marrow signal is otherwise unremarkable. IMPRESSION: Large radial tear at and peripheral to the root of the posterior horn of the medial meniscus. Nondisplaced and incomplete insufficiency fracture medial tibial metaphysis with associated marrow edema. Mild osteoarthritis about the knee. Small Baker's cyst. Electronically Signed   By: Drusilla Kannerhomas  Dalessio M.D.   On: 12/16/2015 13:41   Dg Knee Complete 4 Views Left  12/15/2015  CLINICAL DATA:  Pain, most marked medially EXAM: LEFT KNEE - COMPLETE 4+ VIEW COMPARISON:  None. FINDINGS: Frontal, lateral, bilateral oblique, and sunrise patellar images obtained. There is no demonstrable fracture or dislocation. There is a small joint effusion. There is slight joint space narrowing medially. Other joint spaces appear normal. No erosive change. IMPRESSION: Small joint effusion. No fracture or  dislocation. Slight joint space narrowing medially. Electronically Signed   By: Bretta BangWilliam  Woodruff III M.D.   On: 12/15/2015 13:58     Assessment and Plan:   Insufficiency fracture of tibia, initial encounter  Left knee pain - Plan: DG Knee Complete 4 Views Left, MR Knee Left  Wo Contrast  Acute medial meniscal tear, left, initial encounter  Plain films are grossly unremarkable.  Given the patient's physical examination, had a high Lola concern for potential occult tibial plateau fracture, therefore, we obtained a MRI of the knee on the left without contrast to evaluate for such and additional internal derangement.  In the meantime I placed her in a postoperative knee brace with extension to 0 and flexion to 30 to limit her motion, and asked her to use her walker with minimal weightbearing for now.  At this time, her MRI has returned and the patient has an insufficiency fracture along her tibial metaphysis,, and additionally she has a very large medial meniscal tear.  I have been unsuccessfully attempting to call the patient  multiple times over the last 2 days.  No answer.  Messages have been left.  She is stable in her current postoperative brace, but given the extent of fracture and additionally very large meniscal tear, this patient will need to have surgical consultation and management.  Follow-up: will need surgical consultation for definitive management.  Orders Placed This Encounter  Procedures  . DG Knee Complete 4 Views Left  . MR Knee Left  Wo Contrast    Signed,  Cleveland Paiz T. Itzelle Gains, MD   Patient's Medications  New Prescriptions   No medications on file  Previous Medications   ASPIRIN 81 MG TABLET    Take 81 mg by mouth daily.   CYANOCOBALAMIN (VITAMIN B 12 PO)    Take by mouth.   EPINEPHRINE (EPIPEN 2-PAK) 0.3 MG/0.3 ML IJ SOAJ INJECTION    Inject 0.3 mLs (0.3 mg total) into the muscle once.   VITAMIN D, ERGOCALCIFEROL, (DRISDOL) 50000 UNITS CAPS CAPSULE    Take 1  capsule by mouth once weekly.  Modified Medications   No medications on file  Discontinued Medications   No medications on file

## 2015-12-15 NOTE — Patient Instructions (Signed)
Use your walker whenever you are moving around right now.   REFERRALS TO SPECIALISTS, SPECIAL TESTS (MRI, CT, ULTRASOUNDS)  [facility] or [facility] will help you. ASK CHECK-IN FOR HELP.  Imaging / Special Testing referrals sometimes can be done same day if EMERGENCY, but others can take 2 or 3 days to get an appointment. Starting in 2015, many of the new Medicare plans and Obamacare plans take much longer.   Specialist appointment times vary a great deal, based on their schedule / openings. -- Some specialists have very long wait times. (Example. Dermatology. Multiple months  for non-cancer)

## 2015-12-15 NOTE — Progress Notes (Signed)
Pre visit review using our clinic review tool, if applicable. No additional management support is needed unless otherwise documented below in the visit note. 

## 2015-12-16 ENCOUNTER — Ambulatory Visit (HOSPITAL_COMMUNITY)
Admission: RE | Admit: 2015-12-16 | Discharge: 2015-12-16 | Disposition: A | Discharge status: Home / Self Care | Payer: 59 | Source: Ambulatory Visit | Attending: Family Medicine | Admitting: Family Medicine

## 2015-12-16 ENCOUNTER — Ambulatory Visit (HOSPITAL_COMMUNITY): Admission: RE | Admit: 2015-12-16 | Payer: 59 | Source: Ambulatory Visit

## 2015-12-16 ENCOUNTER — Encounter: Payer: Self-pay | Admitting: Primary Care

## 2015-12-16 DIAGNOSIS — M25562 Pain in left knee: Secondary | ICD-10-CM | POA: Diagnosis not present

## 2015-12-16 DIAGNOSIS — M1712 Unilateral primary osteoarthritis, left knee: Secondary | ICD-10-CM | POA: Insufficient documentation

## 2015-12-16 DIAGNOSIS — X58XXXA Exposure to other specified factors, initial encounter: Secondary | ICD-10-CM | POA: Diagnosis not present

## 2015-12-16 DIAGNOSIS — M84462A Pathological fracture, left tibia, initial encounter for fracture: Secondary | ICD-10-CM | POA: Insufficient documentation

## 2015-12-16 DIAGNOSIS — M7122 Synovial cyst of popliteal space [Baker], left knee: Secondary | ICD-10-CM | POA: Diagnosis not present

## 2015-12-16 DIAGNOSIS — R600 Localized edema: Secondary | ICD-10-CM | POA: Diagnosis not present

## 2015-12-16 DIAGNOSIS — S83242A Other tear of medial meniscus, current injury, left knee, initial encounter: Secondary | ICD-10-CM | POA: Insufficient documentation

## 2015-12-17 ENCOUNTER — Encounter: Payer: Self-pay | Admitting: Family Medicine

## 2015-12-17 ENCOUNTER — Encounter: Payer: Self-pay | Admitting: Primary Care

## 2015-12-19 ENCOUNTER — Encounter: Payer: Self-pay | Admitting: Primary Care

## 2015-12-19 ENCOUNTER — Encounter: Payer: Self-pay | Admitting: Family Medicine

## 2015-12-19 NOTE — Addendum Note (Signed)
Addended by: Hannah BeatOPLAND, [place] on: 12/19/2015 08:13 AM   Modules accepted: Orders

## 2015-12-20 ENCOUNTER — Ambulatory Visit (HOSPITAL_COMMUNITY)
Admission: EM | Admit: 2015-12-20 | Discharge: 2015-12-20 | Disposition: A | Discharge status: Home / Self Care | Payer: 59 | Attending: Family Medicine | Admitting: Family Medicine

## 2015-12-20 ENCOUNTER — Encounter (HOSPITAL_COMMUNITY): Payer: Self-pay | Admitting: *Deleted

## 2015-12-20 DIAGNOSIS — J301 Allergic rhinitis due to pollen: Secondary | ICD-10-CM | POA: Diagnosis not present

## 2015-12-20 MED ORDER — TRIAMCINOLONE ACETONIDE 40 MG/ML IJ SUSP
40.0000 mg | Freq: Once | INTRAMUSCULAR | Status: AC
  Administered 2015-12-20: 40 mg via INTRAMUSCULAR

## 2015-12-20 MED ORDER — TRIAMCINOLONE ACETONIDE 40 MG/ML IJ SUSP
INTRAMUSCULAR | Status: AC
  Filled 2015-12-20: qty 1

## 2015-12-20 MED ORDER — FLUTICASONE PROPIONATE 50 MCG/ACT NA SUSP: 1.0000 | Freq: Two times a day (BID) | NASAL | Status: DC

## 2015-12-20 MED ORDER — METHYLPREDNISOLONE ACETATE 80 MG/ML IJ SUSP
INTRAMUSCULAR | Status: AC
Start: 2015-12-20 — End: 2015-12-20
  Filled 2015-12-20: qty 1

## 2015-12-20 MED ORDER — METHYLPREDNISOLONE ACETATE 40 MG/ML IJ SUSP
80.0000 mg | Freq: Once | INTRAMUSCULAR | Status: AC
  Administered 2015-12-20: 80 mg via INTRAMUSCULAR

## 2015-12-20 MED ORDER — CETIRIZINE HCL 10 MG PO TABS: 10.0000 mg | ORAL_TABLET | Freq: Every day | ORAL | Status: DC

## 2015-12-20 NOTE — ED Notes (Signed)
Pt reports   Symptoms  Of   Sinus  Congestion          Non  Productive   Cough          Headache         Symptoms   X    2  Days   Not  releived  By   [facility] meds

## 2015-12-20 NOTE — ED Provider Notes (Signed)
CSN: [id number]     Arrival date & time 12/20/15  1455 History   First MD Initiated Contact with Patient 12/20/15 1652     Chief Complaint  Patient presents with  . Headache   (Consider location/radiation/quality/duration/timing/severity/associated sxs/prior Treatment) Patient is a 65 y.o. female presenting with URI. The history is provided by the patient.  URI Presenting symptoms: congestion, facial pain and rhinorrhea   Presenting symptoms: no fever   Severity:  Moderate Onset quality:  Gradual Duration:  2 days Progression:  Unchanged Chronicity:  New Relieved by:  None tried Worsened by:  Nothing tried Ineffective treatments:  None tried Associated symptoms: headaches, sinus pain and sneezing     Past Medical History  Diagnosis Date  . Obesity   . Hyperlipidemia   . Stress incontinence    History reviewed. No pertinent past surgical history. Family History  Problem Relation Age of Onset  . Cancer Other   . Obesity Other   . Hypertension Other   . Arthritis Mother   . Hyperlipidemia Mother   . Hypertension Mother   . Cancer Father     lung   Social History  Substance Use Topics  . Smoking status: Never Smoker   . Smokeless tobacco: Never Used  . Alcohol Use: 0.0 oz/week    0 Standard drinks or equivalent per week     Comment: occ   OB History    No data available     Review of Systems  Constitutional: Negative.  Negative for fever.  HENT: Positive for congestion, postnasal drip, rhinorrhea, sinus pressure and sneezing.   Neurological: Positive for headaches.  All other systems reviewed and are negative.   Allergies  Bee venom  Home Medications   Prior to Admission medications   Medication Sig Start Date End Date Taking? Authorizing Provider  aspirin 81 MG tablet Take 81 mg by mouth daily.    Historical Provider, MD  cetirizine (ZYRTEC) 10 MG tablet Take 1 tablet (10 mg total) by mouth daily. One tab daily for allergies 12/20/15   Linna HoffJames D Iann Rodier, MD   Cyanocobalamin (VITAMIN B 12 PO) Take by mouth.    Historical Provider, MD  EPINEPHrine (EPIPEN 2-PAK) 0.3 mg/0.3 mL IJ SOAJ injection Inject 0.3 mLs (0.3 mg total) into the muscle once. 01/10/15   Doreene NestKatherine K Clark, NP  fluticasone (FLONASE) 50 MCG/ACT nasal spray Place 1 spray into both nostrils 2 (two) times daily. 12/20/15   Linna HoffJames D Cherylann Hobday, MD  Vitamin D, Ergocalciferol, (DRISDOL) 50000 UNITS CAPS capsule Take 1 capsule by mouth once weekly. 03/01/15   Doreene NestKatherine K Clark, NP   Meds Ordered and Administered this Visit   Medications  triamcinolone acetonide (KENALOG-40) injection 40 mg (not administered)  methylPREDNISolone acetate (DEPO-MEDROL) injection 80 mg (not administered)    BP 140/71 mmHg  Pulse 91  Temp(Src) 99.1 F (37.3 C) (Oral)  Resp 16  SpO2 100%  LMP 08/01/2013 No data found.   Physical Exam  Constitutional: She is oriented to person, place, and time. She appears well-developed and well-nourished. No distress.  HENT:  Head: Normocephalic.  Right Ear: External ear normal.  Left Ear: External ear normal.  Nose: Nose normal.  Mouth/Throat: Oropharynx is clear and moist.  Eyes: Pupils are equal, round, and reactive to light.  Neck: Normal range of motion. Neck supple.  Cardiovascular: Regular rhythm and normal heart sounds.   Pulmonary/Chest: Effort normal and breath sounds normal.  Lymphadenopathy:    She has no cervical adenopathy.  Neurological:  She is alert and oriented to person, place, and time.  Skin: Skin is warm and dry.  Nursing note and vitals reviewed.   ED Course  Procedures (including critical care time)  Labs Review Labs Reviewed - No data to display  Imaging Review No results found.   Visual Acuity Review  Right Eye Distance:   Left Eye Distance:   Bilateral Distance:    Right Eye Near:   Left Eye Near:    Bilateral Near:         MDM   1. Seasonal allergic rhinitis due to pollen        Linna Hoff, MD 12/20/15  1712

## 2015-12-23 DIAGNOSIS — S83242A Other tear of medial meniscus, current injury, left knee, initial encounter: Secondary | ICD-10-CM | POA: Diagnosis not present

## 2016-01-24 DIAGNOSIS — S83242D Other tear of medial meniscus, current injury, left knee, subsequent encounter: Secondary | ICD-10-CM | POA: Diagnosis not present

## 2016-02-21 DIAGNOSIS — S83242D Other tear of medial meniscus, current injury, left knee, subsequent encounter: Secondary | ICD-10-CM | POA: Diagnosis not present

## 2016-06-21 ENCOUNTER — Telehealth: Payer: 59 | Admitting: Physician Assistant

## 2016-06-21 ENCOUNTER — Telehealth: Payer: 59 | Admitting: Nurse Practitioner

## 2016-06-21 DIAGNOSIS — B9689 Other specified bacterial agents as the cause of diseases classified elsewhere: Secondary | ICD-10-CM | POA: Diagnosis not present

## 2016-06-21 DIAGNOSIS — J019 Acute sinusitis, unspecified: Secondary | ICD-10-CM

## 2016-06-21 DIAGNOSIS — J301 Allergic rhinitis due to pollen: Secondary | ICD-10-CM | POA: Diagnosis not present

## 2016-06-21 MED ORDER — FLUTICASONE PROPIONATE 50 MCG/ACT NA SUSP: 2.0000 | Freq: Every day | NASAL | 0 refills | Status: DC

## 2016-06-21 MED ORDER — DOXYCYCLINE HYCLATE 100 MG PO TABS: 100.0000 mg | ORAL_TABLET | Freq: Two times a day (BID) | ORAL | 0 refills | Status: DC

## 2016-06-21 MED ORDER — CLARINEX 5 MG PO TABS: 5.0000 mg | ORAL_TABLET | Freq: Every day | ORAL | 0 refills | Status: DC

## 2016-06-21 MED FILL — DOXYCYCLINE HYCLATE 100 MG: 100 | 10 days supply | Qty: 20 | Fill #0

## 2016-06-21 NOTE — Progress Notes (Signed)

## 2016-06-21 NOTE — Progress Notes (Signed)
We are sorry that you are not feeling well.  Her is how we plan to help!  Based on what you have shared with me it looks like you have Allergic Rhinitis.  Rhinitis is when a reaction occurs that causes nasal congestion, runny nose, sneezing, and itching.  Most types of rhinitis are caused by an inflammation and are associated with symptoms in the eyes ears or throat. There are several types of rhinitis.  The most common are acute rhinitis, which is usually caused by a viral illness, allergic or seasonal rhinitis, and nonallergic or year-round rhinitis.  Nasal allergies occur certain times of the year.  Allergic rhinitis is caused when allergens in the air trigger the release of histamine in the body.  Histamine causes itching, swelling, and fluid to build up in the fragile linings of the nasal passages, sinuses and eyelids.  An itchy nose and clear discharge are common.  I recommend the following over the counter treatments: Clarinex 5 mg take 1 tablet daily 9Cannot take if pregnant or breastfeeding)  I also would recommend a nasal spray: Flonase 2 sprays into each nostril once daily  You may also benefit from eye drops such as: Visine 1-2 drops each eye twice daily as needed  HOME CARE:   You can use an over-the-counter saline nasal spray as needed  Avoid areas where there is heavy dust, mites, or molds  Stay indoors on windy days during the pollen season  Keep windows closed in home, at least in bedroom; use air conditioner.  Use high-efficiency house air filter  Keep windows closed in car, turn AC on re-circulate  Avoid playing out with dog during pollen season  GET HELP RIGHT AWAY IF:   If your symptoms do not improve within 10 days  You become short of breath  You develop yellow or green discharge from your nose for over 3 days  You have coughing fits  MAKE SURE YOU:   Understand these instructions  Will watch your condition  Will get help right away if you are  not doing well or get worse  Thank you for choosing an e-visit. Your e-visit answers were reviewed by a board certified advanced clinical practitioner to complete your personal care plan. Depending upon the condition, your plan could have included both over the counter or prescription medications. Please review your pharmacy choice. Be sure that the pharmacy you have chosen is open so that you can pick up your prescription now.  If there is a problem you may message your provider in MyChart to have the prescription routed to another pharmacy. Your safety is important to us. If you have drug allergies check your prescription carefully.  For the next 24 hours, you can use MyChart to ask questions about today's visit, request a non-urgent call back, or ask for a work or school excuse from your e-visit provider. You will get an email in the next two days asking about your experience. I hope that your e-visit has been valuable and will speed your recovery.

## 2016-08-17 ENCOUNTER — Other Ambulatory Visit: Payer: Self-pay | Admitting: Primary Care

## 2016-08-17 DIAGNOSIS — Z1231 Encounter for screening mammogram for malignant neoplasm of breast: Secondary | ICD-10-CM

## 2016-08-28 ENCOUNTER — Other Ambulatory Visit: Payer: Self-pay | Admitting: Primary Care

## 2016-08-28 ENCOUNTER — Ambulatory Visit
Admission: RE | Admit: 2016-08-28 | Discharge: 2016-08-28 | Disposition: A | Discharge status: Home / Self Care | Payer: 59 | Source: Ambulatory Visit | Attending: Primary Care | Admitting: Primary Care

## 2016-08-28 DIAGNOSIS — Z1231 Encounter for screening mammogram for malignant neoplasm of breast: Secondary | ICD-10-CM

## 2016-08-28 DIAGNOSIS — E785 Hyperlipidemia, unspecified: Secondary | ICD-10-CM

## 2016-08-28 DIAGNOSIS — Z1159 Encounter for screening for other viral diseases: Secondary | ICD-10-CM

## 2016-08-28 DIAGNOSIS — E559 Vitamin D deficiency, unspecified: Secondary | ICD-10-CM

## 2016-09-04 ENCOUNTER — Other Ambulatory Visit (INDEPENDENT_AMBULATORY_CARE_PROVIDER_SITE_OTHER): Payer: 59

## 2016-09-04 DIAGNOSIS — E785 Hyperlipidemia, unspecified: Secondary | ICD-10-CM | POA: Diagnosis not present

## 2016-09-04 DIAGNOSIS — E559 Vitamin D deficiency, unspecified: Secondary | ICD-10-CM

## 2016-09-04 DIAGNOSIS — Z1159 Encounter for screening for other viral diseases: Secondary | ICD-10-CM | POA: Diagnosis not present

## 2016-09-04 LAB — LIPID PANEL
CHOL/HDL RATIO: 5
Cholesterol: 215 mg/dL — ABNORMAL HIGH (ref 0–200)
HDL: 46.3 mg/dL (ref >39.00)
LDL Cholesterol: 140 mg/dL — ABNORMAL HIGH (ref 0–99)
NONHDL: 168.36
Triglycerides: 144 mg/dL (ref 0.0–149.0)
VLDL: 28.8 mg/dL (ref 0.0–40.0)

## 2016-09-04 LAB — COMPREHENSIVE METABOLIC PANEL
ALK PHOS: 54 U/L (ref 39–117)
ALT: 25 U/L (ref 0–35)
AST: 21 U/L (ref 0–37)
Albumin: 4.1 g/dL (ref 3.5–5.2)
BILIRUBIN TOTAL: 0.3 mg/dL (ref 0.2–1.2)
BUN: 14 mg/dL (ref 6–23)
CO2: 30 mEq/L (ref 19–32)
CREATININE: 0.69 mg/dL (ref 0.40–1.20)
Calcium: 9 mg/dL (ref 8.4–10.5)
Chloride: 106 mEq/L (ref 96–112)
GFR: 90.94 mL/min (ref >60.00)
GLUCOSE: 92 mg/dL (ref 70–99)
Potassium: 4.3 mEq/L (ref 3.5–5.1)
Sodium: 141 mEq/L (ref 135–145)
TOTAL PROTEIN: 6.9 g/dL (ref 6.0–8.3)

## 2016-09-04 LAB — VITAMIN D 25 HYDROXY (VIT D DEFICIENCY, FRACTURES): VITD: 53.84 ng/mL (ref 30.00–100.00)

## 2016-09-05 LAB — HEPATITIS C ANTIBODY: HCV Ab: NEGATIVE

## 2016-09-07 ENCOUNTER — Encounter: Payer: Self-pay | Admitting: Primary Care

## 2016-09-07 ENCOUNTER — Ambulatory Visit (INDEPENDENT_AMBULATORY_CARE_PROVIDER_SITE_OTHER): Payer: 59 | Admitting: Primary Care

## 2016-09-07 VITALS — BP 144/92 | HR 81 | Temp 98.1°F | Ht 62.0 in | Wt 204.4 lb

## 2016-09-07 DIAGNOSIS — E785 Hyperlipidemia, unspecified: Secondary | ICD-10-CM | POA: Diagnosis not present

## 2016-09-07 DIAGNOSIS — Z23 Encounter for immunization: Secondary | ICD-10-CM

## 2016-09-07 DIAGNOSIS — R030 Elevated blood-pressure reading, without diagnosis of hypertension: Secondary | ICD-10-CM

## 2016-09-07 DIAGNOSIS — E559 Vitamin D deficiency, unspecified: Secondary | ICD-10-CM | POA: Diagnosis not present

## 2016-09-07 DIAGNOSIS — Z0000 Encounter for general adult medical examination without abnormal findings: Secondary | ICD-10-CM

## 2016-09-07 NOTE — Assessment & Plan Note (Signed)
Stable, continue calcium with vitamin D. Bone density scan due in 1 year.

## 2016-09-07 NOTE — Addendum Note (Signed)
Addended by: Tawnya CrookSAMBATH, Sephiroth Mcluckie on: 09/07/2016 04:49 PM   Modules accepted: Orders

## 2016-09-07 NOTE — Assessment & Plan Note (Signed)
About the same as 2016. Discussed the importance of a healthy diet and regular exercise in order for weight loss, and to reduce the risk of other medical diseases. Will closely monitor.

## 2016-09-07 NOTE — Progress Notes (Signed)
Subjective:    Patient ID: Raven Campos, female    DOB: September 16, 1950, 66 y.o.   MRN: [id number]  HPI  Raven Campos is a 66 year old female who presents today for complete physical.  Immunizations: -Tetanus: Completed in 2007, due. -Influenza: Completed in Fall 2017 -Shingles: Completed in 2016  Diet: She endorses a fair diet. Breakfast: Fast food once weekly, cereal, oatmeal, eggs Lunch: Left overs, salad, skips Dinner: Meat, vegetables, potatoes, uses air fryer, beans Snacks: Pretzels, fruit, candy, nuts Desserts: Occasionally Beverages: Sodas, some water  Exercise: She is not currently exercising. Eye exam: Scheduled. Dental exam: Completes semi-annually. Colonoscopy: Never completed. Last year she was interested in Cologuard. Dexa: Completed in 2016, low bone mass Pap Smear: Completed in 2016, normal. Due in 2019. Mammogram: Completed in 08/2016 Hep C Screen: Completed, negative    Review of Systems  Constitutional: Negative for unexpected weight change.  HENT: Negative for rhinorrhea.   Respiratory: Negative for cough and shortness of breath.   Cardiovascular: Negative for chest pain.  Gastrointestinal: Negative for constipation and diarrhea.  Genitourinary: Negative for difficulty urinating and menstrual problem.  Musculoskeletal: Negative for arthralgias and myalgias.  Skin: Negative for rash.  Allergic/Immunologic: Negative for environmental allergies.  Neurological: Negative for dizziness, numbness and headaches.  Psychiatric/Behavioral:       Denies concerns for anxiety or depression       Past Medical History:  Diagnosis Date  . Hyperlipidemia   . Obesity   . Stress incontinence      Social History   Social History  . Marital status: Married    Spouse name: N/A  . Number of children: N/A  . Years of education: N/A   Occupational History  . Not on file.   Social History Main Topics  . Smoking status: Never Smoker  . Smokeless tobacco:  Never Used  . Alcohol use 0.0 oz/week     Comment: occ  . Drug use: No  . Sexual activity: Not on file   Other Topics Concern  . Not on file   Social History Narrative   Married.   Highest level of education: Associates degree in Nursing.   Works at [employer].   Enjoys gardening, yard work, sewing, working puzzles.    No past surgical history on file.  Family History  Problem Relation Age of Onset  . Cancer Other   . Obesity Other   . Hypertension Other   . Arthritis Mother   . Hyperlipidemia Mother   . Hypertension Mother   . Cancer Father     lung    Allergies  Allergen Reactions  . Bee Venom     Current Outpatient Prescriptions on File Prior to Visit  Medication Sig Dispense Refill  . aspirin 81 MG tablet Take 81 mg by mouth daily.    . cetirizine (ZYRTEC) 10 MG tablet Take 1 tablet (10 mg total) by mouth daily. One tab daily for allergies 30 tablet 1  . CLARINEX 5 MG tablet Take 1 tablet (5 mg total) by mouth daily. 10 tablet 0  . Cyanocobalamin (VITAMIN B 12 PO) Take by mouth.    . EPINEPHrine (EPIPEN 2-PAK) 0.3 mg/0.3 mL IJ SOAJ injection Inject 0.3 mLs (0.3 mg total) into the muscle once. 1 Device 1  . fluticasone (FLONASE) 50 MCG/ACT nasal spray Place 2 sprays into both nostrils daily. 16 g 0   No current facility-administered medications on file prior to visit.  BP (!) 144/92   Pulse 81   Temp 98.1 F (36.7 C) (Oral)   Ht 5\' 2"  (1.575 m)   Wt 204 lb 6.4 oz (92.7 kg)   LMP 08/01/2013   SpO2 95%   BMI 37.39 kg/m    Objective:   Physical Exam  Constitutional: She is oriented to person, place, and time. She appears well-nourished.  HENT:  Right Ear: Tympanic membrane and ear canal normal.  Left Ear: Tympanic membrane and ear canal normal.  Nose: Nose normal.  Mouth/Throat: Oropharynx is clear and moist.  Eyes: Conjunctivae and EOM are normal. Pupils are equal, round, and reactive to light.  Neck: Neck supple. No  thyromegaly present.  Cardiovascular: Normal rate and regular rhythm.   No murmur heard. Pulmonary/Chest: Effort normal and breath sounds normal. She has no rales.  Abdominal: Soft. Bowel sounds are normal. There is no tenderness.  Musculoskeletal: Normal range of motion.  Lymphadenopathy:    She has no cervical adenopathy.  Neurological: She is alert and oriented to person, place, and time. She has normal reflexes. No cranial nerve deficit.  Skin: Skin is warm and dry. No rash noted.  Psychiatric: She has a normal mood and affect.          Assessment & Plan:

## 2016-09-07 NOTE — Patient Instructions (Addendum)
Complete the Cologuard specimen as discussed.  Cholesterol: Your cholesterol levels are slightly too high. Improvement in your diet and regular exercise will help to lower these levels. Reduce fast food, fried food, processed carbohydrates (chips, cookies, etc), sugary drinks. Increase consumption of fresh fruits and vegetables, whole grains, water. Exercise will also lower these levels.  Start exercising. You should be getting 150 minutes of moderate intensity exercise weekly. Work up to this.  Ensure you are consuming 64 ounces of water daily.  You were provided with a tetanus vaccination today, this will cover you for 10 years.  We will repeat your Pap next year.  Continue taking Calcium with Vitamin D.  Follow up in 1 year for your annual physical or sooner if needed.  It was a pleasure to see you today!

## 2016-09-07 NOTE — Assessment & Plan Note (Signed)
Above goal in office today, weigh gain of 4 pounds since last year. Will have her work on weight loss through healthy diet and exercise. Continue to monitor.

## 2016-09-07 NOTE — Progress Notes (Signed)
Pre visit review using our clinic review tool, if applicable. No additional management support is needed unless otherwise documented below in the visit note. 

## 2016-09-07 NOTE — Assessment & Plan Note (Signed)
Td due, provided today. Influenza and shingles UTD. Mammogram and pap UTD. Re-ordered Cologuard for colon cancer screening. Discussed the importance of a healthy diet and regular exercise in order for weight loss, and to reduce the risk of other medical diseases. Exam unremarkable. Labs with hyperlipidemia, overall about the same. Otherwise exam unremarkable. Follow up in 1 year.

## 2016-09-13 DIAGNOSIS — H31091 Other chorioretinal scars, right eye: Secondary | ICD-10-CM | POA: Diagnosis not present

## 2016-09-13 DIAGNOSIS — H04123 Dry eye syndrome of bilateral lacrimal glands: Secondary | ICD-10-CM | POA: Diagnosis not present

## 2016-09-13 DIAGNOSIS — H2511 Age-related nuclear cataract, right eye: Secondary | ICD-10-CM | POA: Diagnosis not present

## 2016-09-13 DIAGNOSIS — H25812 Combined forms of age-related cataract, left eye: Secondary | ICD-10-CM | POA: Diagnosis not present

## 2016-10-01 DIAGNOSIS — Z1212 Encounter for screening for malignant neoplasm of rectum: Secondary | ICD-10-CM | POA: Diagnosis not present

## 2016-10-01 DIAGNOSIS — Z1211 Encounter for screening for malignant neoplasm of colon: Secondary | ICD-10-CM | POA: Diagnosis not present

## 2016-10-02 ENCOUNTER — Encounter: Payer: Self-pay | Admitting: Primary Care

## 2016-10-02 LAB — COLOGUARD: COLOGUARD: NEGATIVE

## 2016-10-11 DIAGNOSIS — H25813 Combined forms of age-related cataract, bilateral: Secondary | ICD-10-CM | POA: Diagnosis not present

## 2016-10-11 DIAGNOSIS — H35363 Drusen (degenerative) of macula, bilateral: Secondary | ICD-10-CM | POA: Diagnosis not present

## 2016-10-11 DIAGNOSIS — Q141 Congenital malformation of retina: Secondary | ICD-10-CM | POA: Diagnosis not present

## 2016-10-12 ENCOUNTER — Encounter: Payer: Self-pay | Admitting: Primary Care

## 2017-03-19 DIAGNOSIS — H31091 Other chorioretinal scars, right eye: Secondary | ICD-10-CM | POA: Diagnosis not present

## 2017-03-19 DIAGNOSIS — H04123 Dry eye syndrome of bilateral lacrimal glands: Secondary | ICD-10-CM | POA: Diagnosis not present

## 2017-03-19 DIAGNOSIS — H2511 Age-related nuclear cataract, right eye: Secondary | ICD-10-CM | POA: Diagnosis not present

## 2017-03-19 DIAGNOSIS — H25812 Combined forms of age-related cataract, left eye: Secondary | ICD-10-CM | POA: Diagnosis not present

## 2017-05-24 DIAGNOSIS — M67911 Unspecified disorder of synovium and tendon, right shoulder: Secondary | ICD-10-CM | POA: Diagnosis not present

## 2017-05-24 DIAGNOSIS — M7531 Calcific tendinitis of right shoulder: Secondary | ICD-10-CM | POA: Diagnosis not present

## 2017-05-24 DIAGNOSIS — M25511 Pain in right shoulder: Secondary | ICD-10-CM | POA: Diagnosis not present

## 2017-05-24 MED FILL — ETODOLAC 400 MG TABLET: 400 | 30 days supply | Qty: 60 | Fill #0

## 2017-05-30 DIAGNOSIS — M25511 Pain in right shoulder: Secondary | ICD-10-CM | POA: Diagnosis not present

## 2017-05-30 DIAGNOSIS — M7531 Calcific tendinitis of right shoulder: Secondary | ICD-10-CM | POA: Diagnosis not present

## 2017-06-30 ENCOUNTER — Encounter: Payer: Self-pay | Admitting: Primary Care

## 2017-07-11 ENCOUNTER — Telehealth: Payer: 59 | Admitting: Physician Assistant

## 2017-07-11 DIAGNOSIS — J019 Acute sinusitis, unspecified: Secondary | ICD-10-CM | POA: Diagnosis not present

## 2017-07-11 DIAGNOSIS — B9689 Other specified bacterial agents as the cause of diseases classified elsewhere: Secondary | ICD-10-CM

## 2017-07-11 MED ORDER — AMOXICILLIN-POT CLAVULANATE 875-125 MG PO TABS: 1.0000 | ORAL_TABLET | Freq: Two times a day (BID) | ORAL | 0 refills | Status: DC

## 2017-07-11 MED FILL — AMOX-CLAV 875-125 MG TABLET: 875-125 | 7 days supply | Qty: 14 | Fill #0

## 2017-07-11 NOTE — Progress Notes (Signed)

## 2017-07-26 ENCOUNTER — Other Ambulatory Visit: Payer: Self-pay | Admitting: Internal Medicine

## 2017-07-26 ENCOUNTER — Other Ambulatory Visit: Payer: Self-pay | Admitting: Primary Care

## 2017-07-26 DIAGNOSIS — Z1231 Encounter for screening mammogram for malignant neoplasm of breast: Secondary | ICD-10-CM

## 2017-08-12 ENCOUNTER — Ambulatory Visit (INDEPENDENT_AMBULATORY_CARE_PROVIDER_SITE_OTHER): Payer: Self-pay | Admitting: Nurse Practitioner

## 2017-08-12 VITALS — BP 138/86 | HR 86 | Temp 98.7°F | Resp 17 | Wt 203.4 lb

## 2017-08-12 DIAGNOSIS — J209 Acute bronchitis, unspecified: Secondary | ICD-10-CM

## 2017-08-12 DIAGNOSIS — J0100 Acute maxillary sinusitis, unspecified: Secondary | ICD-10-CM

## 2017-08-12 MED ORDER — PREDNISONE 10 MG (21) PO TBPK: ORAL_TABLET | ORAL | 0 refills | Status: AC

## 2017-08-12 MED ORDER — LEVOFLOXACIN 500 MG PO TABS: 500.0000 mg | ORAL_TABLET | Freq: Every day | ORAL | 0 refills | Status: AC

## 2017-08-12 MED ORDER — FLUTICASONE PROPIONATE 50 MCG/ACT NA SUSP: 2.0000 | Freq: Every day | NASAL | 0 refills | Status: DC

## 2017-08-12 MED ORDER — DEXTROMETHORPHAN-GUAIFENESIN 10-100 MG/5ML PO LIQD: 10.0000 mL | Freq: Four times a day (QID) | ORAL | 0 refills | Status: AC | PRN

## 2017-08-12 MED FILL — predniSONE 10 MG TABS: 10 | 6 days supply | Qty: 21 | Fill #0

## 2017-08-12 MED FILL — levoFLOXacin 500 MG TABS: 500 | 7 days supply | Qty: 7 | Fill #0

## 2017-08-12 MED FILL — FLUTICASONE PROP 50 MCG SPR: 50 | 30 days supply | Qty: 16 | Fill #0

## 2017-08-12 NOTE — Progress Notes (Signed)
Subjective:  Raven Campos is a 67 y.o. female who presents for evaluation of possible sinusitis.  Symptoms include fever: suspected fevers but not measured at home, headache described as dull, nasal congestion, non productive cough, post nasal drip, productive cough with yellow colored sputum, sinus pressure and sinus pain.  Onset of symptoms was several weeks ago, and has been gradually worsening since that time.  Treatment to date:  antibiotics, cough suppressants and decongestants.  High risk factors for influenza complications:  age greater than 66 years of age.  The following portions of the patient's history were reviewed and updated as appropriate:  allergies, current medications and past medical history.  Constitutional: positive for fatigue and fevers, negative for anorexia and sweats Eyes: negative Ears, nose, mouth, throat, and face: positive for nasal congestion, sore throat and ear pressure/fullness, negative for ear drainage and hoarseness Respiratory: positive for cough Cardiovascular: negative Neurological: positive for headaches Behavioral/Psych: negative Objective:  BP 138/86 (BP Location: Right Arm, Patient Position: Sitting, Cuff Size: Large)   Pulse 86   Temp 98.7 F (37.1 C) (Oral)   Resp 17   Wt 203 lb 6.4 oz (92.3 kg)   LMP 08/01/2013   SpO2 96%   BMI 37.20 kg/m  General appearance: alert, cooperative, fatigued, mild distress and due to excessive coughing Head: Normocephalic, without obvious abnormality, atraumatic Eyes: conjunctivae/corneas clear. PERRL, EOM's intact. Fundi benign. Ears: abnormal TM right ear - mucoid middle ear fluid and abnormal TM left ear - mucoid middle ear fluid Nose: no discharge, turbinates swollen, inflamed, moderate maxillary sinus tenderness bilateral, no frontal sinus tenderness bilateral Throat: lips, mucosa, and tongue normal; teeth and gums normal Lungs: clear to auscultation bilaterally Heart: regular rate and rhythm, S1, S2  normal, no murmur, click, rub or gallop Pulses: 2+ and symmetric Skin: Skin color, texture, turgor normal. No rashes or lesions Lymph nodes: cervical and submandibular nodes normal Neurologic: Grossly normal    Assessment:  Acute Maxillary Sinusitis and Acute Bronchitis    Plan:  Discussed diagnosis and treatment of sinusitis. Educational material distributed and questions answered. Suggested symptomatic OTC remedies. Supportive care with appropriate antipyretics and fluids. Nasal saline spray for congestion. Levaquin and Sterapred Dose Pack, Flonase as directed.  per orders. Nasal steroids per orders. Follow up as needed. Increase fluids, continue Robitussin.  Have prescription filled for Tussin cough syrup if needed.

## 2017-08-12 NOTE — Patient Instructions (Addendum)
Acute Bronchitis, Adult Acute bronchitis is sudden (acute) swelling of the air tubes (bronchi) in the lungs. Acute bronchitis causes these tubes to fill with mucus, which can make it hard to breathe. It can also cause coughing or wheezing. In adults, acute bronchitis usually goes away within 2 weeks. A cough caused by bronchitis may last up to 3 weeks. Smoking, allergies, and asthma can make the condition worse. Repeated episodes of bronchitis may cause further lung problems, such as chronic obstructive pulmonary disease (COPD). What are the causes? This condition can be caused by germs and by substances that irritate the lungs, including:  Cold and flu viruses. This condition is most often caused by the same virus that causes a cold.  Bacteria.  Exposure to tobacco smoke, dust, fumes, and air pollution.  What increases the risk? This condition is more likely to develop in people who:  Have close contact with someone with acute bronchitis.  Are exposed to lung irritants, such as tobacco smoke, dust, fumes, and vapors.  Have a weak immune system.  Have a respiratory condition such as asthma.  What are the signs or symptoms? Symptoms of this condition include:  A cough.  Coughing up clear, yellow, or green mucus.  Wheezing.  Chest congestion.  Shortness of breath.  A fever.  Body aches.  Chills.  A sore throat.  How is this diagnosed? This condition is usually diagnosed with a physical exam. During the exam, your health care provider may order tests, such as chest X-rays, to rule out other conditions. He or she may also:  Test a sample of your mucus for bacterial infection.  Check the level of oxygen in your blood. This is done to check for pneumonia.  Do a chest X-ray or lung function testing to rule out pneumonia and other conditions.  Perform blood tests.  Your health care provider will also ask about your symptoms and medical history. How is this  treated? Most cases of acute bronchitis clear up over time without treatment. Your health care provider may recommend:  Drinking more fluids. Drinking more makes your mucus thinner, which may make it easier to breathe.  Taking a medicine for a fever or cough.  Taking an antibiotic medicine.  Using an inhaler to help improve shortness of breath and to control a cough.  Using a cool mist vaporizer or humidifier to make it easier to breathe.  Follow these instructions at home: Medicines  Take over-the-counter and prescription medicines only as told by your health care provider.  If you were prescribed an antibiotic, take it as told by your health care provider. Do not stop taking the antibiotic even if you start to feel better. General instructions  Get plenty of rest.  Drink enough fluids to keep your urine clear or pale yellow.  Avoid smoking and secondhand smoke. Exposure to cigarette smoke or irritating chemicals will make bronchitis worse. If you smoke and you need help quitting, ask your health care provider. Quitting smoking will help your lungs heal faster.  Use an inhaler, cool mist vaporizer, or humidifier as told by your health care provider.  Keep all follow-up visits as told by your health care provider. This is important. How is this prevented? To lower your risk of getting this condition again:  Wash your hands often with soap and water. If soap and water are not available, use hand sanitizer.  Avoid contact with people who have cold symptoms.  Try not to touch your hands to your   mouth, nose, or eyes.  Make sure to get the flu shot every year.  Contact a health care provider if:  Your symptoms do not improve in 2 weeks of treatment. Get help right away if:  You cough up blood.  You have chest pain.  You have severe shortness of breath.  You become dehydrated.  You faint or keep feeling like you are going to faint.  You keep vomiting.  You have a  severe headache.  Your fever or chills gets worse. This information is not intended to replace advice given to you by your health care provider. Make sure you discuss any questions you have with your health care provider. Document Released: 08/30/2004 Document Revised: 02/15/2016 Document Reviewed: 01/11/2016 Elsevier Interactive Patient Education  2018 [employer]  Sinusitis, Adult Sinusitis is soreness and inflammation of your sinuses. Sinuses are hollow spaces in the bones around your face. Your sinuses are located:  Around your eyes.  In the middle of your forehead.  Behind your nose.  In your cheekbones.  Your sinuses and nasal passages are lined with a stringy fluid (mucus). Mucus normally drains out of your sinuses. When your nasal tissues become inflamed or swollen, the mucus can become trapped or blocked so air cannot flow through your sinuses. This allows bacteria, viruses, and funguses to grow, which leads to infection. Sinusitis can develop quickly and last for 7?10 days (acute) or for more than 12 weeks (chronic). Sinusitis often develops after a cold. What are the causes? This condition is caused by anything that creates swelling in the sinuses or stops mucus from draining, including:  Allergies.  Asthma.  Bacterial or viral infection.  Abnormally shaped bones between the nasal passages.  Nasal growths that contain mucus (nasal polyps).  Narrow sinus openings.  Pollutants, such as chemicals or irritants in the air.  A foreign object stuck in the nose.  A fungal infection. This is rare.  What increases the risk? The following factors may make you more likely to develop this condition:  Having allergies or asthma.  Having had a recent cold or respiratory tract infection.  Having structural deformities or blockages in your nose or sinuses.  Having a weak immune system.  Doing a lot of swimming or diving.  Overusing nasal sprays.  Smoking.  What  are the signs or symptoms? The main symptoms of this condition are pain and a feeling of pressure around the affected sinuses. Other symptoms include:  Upper toothache.  Earache.  Headache.  Bad breath.  Decreased sense of smell and taste.  A cough that may get worse at night.  Fatigue.  Fever.  Thick drainage from your nose. The drainage is often green and it may contain pus (purulent).  Stuffy nose or congestion.  Postnasal drip. This is when extra mucus collects in the throat or back of the nose.  Swelling and warmth over the affected sinuses.  Sore throat.  Sensitivity to light.  How is this diagnosed? This condition is diagnosed based on symptoms, a medical history, and a physical exam. To find out if your condition is acute or chronic, your health care provider may:  Look in your nose for signs of nasal polyps.  Tap over the affected sinus to check for signs of infection.  View the inside of your sinuses using an imaging device that has a light attached (endoscope).  If your health care provider suspects that you have chronic sinusitis, you may also:  Be tested for allergies.    Have a sample of mucus taken from your nose (nasal culture) and checked for bacteria.  Have a mucus sample examined to see if your sinusitis is related to an allergy.  If your sinusitis does not respond to treatment and it lasts longer than 8 weeks, you may have an MRI or CT scan to check your sinuses. These scans also help to determine how severe your infection is. In rare cases, a bone biopsy may be done to rule out more serious types of fungal sinus disease. How is this treated? Treatment for sinusitis depends on the cause and whether your condition is chronic or acute. If a virus is causing your sinusitis, your symptoms will go away on their own within 10 days. You may be given medicines to relieve your symptoms, including:  Topical nasal decongestants. They shrink swollen nasal  passages and let mucus drain from your sinuses.  Antihistamines. These drugs block inflammation that is triggered by allergies. This can help to ease swelling in your nose and sinuses.  Topical nasal corticosteroids. These are nasal sprays that ease inflammation and swelling in your nose and sinuses.  Nasal saline washes. These rinses can help to get rid of thick mucus in your nose.  If your condition is caused by bacteria, you will be given an antibiotic medicine. If your condition is caused by a fungus, you will be given an antifungal medicine. Surgery may be needed to correct underlying conditions, such as narrow nasal passages. Surgery may also be needed to remove polyps. Follow these instructions at home: Medicines  Take, use, or apply over-the-counter and prescription medicines only as told by your health care provider. These may include nasal sprays.  If you were prescribed an antibiotic medicine, take it as told by your health care provider. Do not stop taking the antibiotic even if you start to feel better. Hydrate and Humidify  Drink enough water to keep your urine clear or pale yellow. Staying hydrated will help to thin your mucus.  Use a cool mist humidifier to keep the humidity level in your home above 50%.  Inhale steam for 10-15 minutes, 3-4 times a day or as told by your health care provider. You can do this in the bathroom while a hot shower is running.  Limit your exposure to cool or dry air. Rest  Rest as much as possible.  Sleep with your head raised (elevated).  Make sure to get enough sleep each night. General instructions  Apply a warm, moist washcloth to your face 3-4 times a day or as told by your health care provider. This will help with discomfort.  Wash your hands often with soap and water to reduce your exposure to viruses and other germs. If soap and water are not available, use hand sanitizer.  Do not smoke. Avoid being around people who are  smoking (secondhand smoke).  Keep all follow-up visits as told by your health care provider. This is important. Contact a health care provider if:  You have a fever.  Your symptoms get worse.  Your symptoms do not improve within 10 days. Get help right away if:  You have a severe headache.  You have persistent vomiting.  You have pain or swelling around your face or eyes.  You have vision problems.  You develop confusion.  Your neck is stiff.  You have trouble breathing. This information is not intended to replace advice given to you by your health care provider. Make sure you discuss any questions you   have with your health care provider. Document Released: 07/23/2005 Document Revised: 03/18/2016 Document Reviewed: 05/18/2015 Elsevier Interactive Patient Education  2018 Elsevier Inc.  

## 2017-08-14 ENCOUNTER — Telehealth: Payer: Self-pay

## 2017-09-03 ENCOUNTER — Other Ambulatory Visit: Payer: Self-pay | Admitting: Primary Care

## 2017-09-03 ENCOUNTER — Ambulatory Visit (INDEPENDENT_AMBULATORY_CARE_PROVIDER_SITE_OTHER): Payer: 59 | Admitting: Primary Care

## 2017-09-03 ENCOUNTER — Encounter: Payer: Self-pay | Admitting: Primary Care

## 2017-09-03 VITALS — BP 136/84 | HR 81 | Temp 98.1°F | Ht 62.0 in | Wt 203.8 lb

## 2017-09-03 DIAGNOSIS — Z0000 Encounter for general adult medical examination without abnormal findings: Secondary | ICD-10-CM

## 2017-09-03 DIAGNOSIS — E2839 Other primary ovarian failure: Secondary | ICD-10-CM

## 2017-09-03 DIAGNOSIS — E785 Hyperlipidemia, unspecified: Secondary | ICD-10-CM

## 2017-09-03 DIAGNOSIS — Z23 Encounter for immunization: Secondary | ICD-10-CM

## 2017-09-03 DIAGNOSIS — E559 Vitamin D deficiency, unspecified: Secondary | ICD-10-CM | POA: Diagnosis not present

## 2017-09-03 DIAGNOSIS — R739 Hyperglycemia, unspecified: Secondary | ICD-10-CM

## 2017-09-03 LAB — COMPREHENSIVE METABOLIC PANEL
ALBUMIN: 4 g/dL (ref 3.5–5.2)
ALT: 26 U/L (ref 0–35)
AST: 21 U/L (ref 0–37)
Alkaline Phosphatase: 52 U/L (ref 39–117)
BUN: 17 mg/dL (ref 6–23)
CALCIUM: 9.2 mg/dL (ref 8.4–10.5)
CHLORIDE: 107 meq/L (ref 96–112)
CO2: 33 mEq/L — ABNORMAL HIGH (ref 19–32)
Creatinine, Ser: 0.72 mg/dL (ref 0.40–1.20)
GFR: 86.31 mL/min (ref >60.00)
Glucose, Bld: 102 mg/dL — ABNORMAL HIGH (ref 70–99)
Potassium: 4.8 mEq/L (ref 3.5–5.1)
SODIUM: 146 meq/L — AB (ref 135–145)
Total Bilirubin: 0.5 mg/dL (ref 0.2–1.2)
Total Protein: 6.8 g/dL (ref 6.0–8.3)

## 2017-09-03 LAB — VITAMIN D 25 HYDROXY (VIT D DEFICIENCY, FRACTURES): VITD: 40.18 ng/mL (ref 30.00–100.00)

## 2017-09-03 LAB — LIPID PANEL
CHOLESTEROL: 208 mg/dL — AB (ref 0–200)
HDL: 54.5 mg/dL (ref >39.00)
LDL CALC: 134 mg/dL — AB (ref 0–99)
NonHDL: 153.02
TRIGLYCERIDES: 97 mg/dL (ref 0.0–149.0)
Total CHOL/HDL Ratio: 4
VLDL: 19.4 mg/dL (ref 0.0–40.0)

## 2017-09-03 NOTE — Assessment & Plan Note (Signed)
Due for repeat labs, pending. Continue vitamin D with calcium.

## 2017-09-03 NOTE — Assessment & Plan Note (Signed)
Immunizations UTD, Prevnar due and provided today. Mammogram due and is scheduled for later this week. Colon cancer screening UTD. No need for Pap smear due to age, patient agrees. Discussed the importance of a healthy diet and regular exercise in order for weight loss, and to reduce the risk of any potential medical problems. Exam unremarkable. Labs pending. Follow up in 1 year.

## 2017-09-03 NOTE — Patient Instructions (Signed)
Stop by the lab prior to leaving today. I will notify you of your results once received.   [facility] density test.  Start exercising. You should be getting 150 minutes of moderate intensity exercise weekly.  It's important to improve your diet by reducing consumption of  processed snack foods, reduce portion sizes. Increase consumption of fresh vegetables and fruits, whole grains, water.  Ensure you are drinking 64 ounces of water daily.  Follow up in 1 year for your annual exam or sooner if needed.  It was a pleasure to see you today!   Preventive Care 66 Years and Older, Female Preventive care refers to lifestyle choices and visits with your health care provider that can promote health and wellness. What does preventive care include?  A yearly physical exam. This is also called an annual well check.  Dental exams once or twice a year.  Routine eye exams. Ask your health care provider how often you should have your eyes checked.  Personal lifestyle choices, including: ? Daily care of your teeth and gums. ? Regular physical activity. ? Eating a healthy diet. ? Avoiding tobacco and drug use. ? Limiting alcohol use. ? Practicing safe sex. ? Taking low-dose aspirin every day. ? Taking vitamin and mineral supplements as recommended by your health care provider. What happens during an annual well check? The services and screenings done by your health care provider during your annual well check will depend on your age, overall health, lifestyle risk factors, and family history of disease. Counseling Your health care provider may ask you questions about your:  Alcohol use.  Tobacco use.  Drug use.  Emotional well-being.  Home and relationship well-being.  Sexual activity.  Eating habits.  History of falls.  Memory and ability to understand (cognition).  Work and work environment.  Reproductive health.  Screening You may have  the following tests or measurements:  Height, weight, and BMI.  Blood pressure.  Lipid and cholesterol levels. These may be checked every 5 years, or more frequently if you are over 50 years old.  Skin check.  Lung cancer screening. You may have this screening every year starting at age 55 if you have a 30-pack-year history of smoking and currently smoke or have quit within the past 15 years.  Fecal occult blood test (FOBT) of the stool. You may have this test every year starting at age 50.  Flexible sigmoidoscopy or colonoscopy. You may have a sigmoidoscopy every 5 years or a colonoscopy every 10 years starting at age 50.  Hepatitis C blood test.  Hepatitis B blood test.  Sexually transmitted disease (STD) testing.  Diabetes screening. This is done by checking your blood sugar (glucose) after you have not eaten for a while (fasting). You may have this done every 1-3 years.  Bone density scan. This is done to screen for osteoporosis. You may have this done starting at age 65.  Mammogram. This may be done every 1-2 years. Talk to your health care provider about how often you should have regular mammograms.  Talk with your health care provider about your test results, treatment options, and if necessary, the need for more tests. Vaccines Your health care provider may recommend certain vaccines, such as:  Influenza vaccine. This is recommended every year.  Tetanus, diphtheria, and acellular pertussis (Tdap, Td) vaccine. You may need a Td booster every 10 years.  Varicella vaccine. You may need this if you have not been vaccinated.    Zoster vaccine. You may need this after age 60.  Measles, mumps, and rubella (MMR) vaccine. You may need at least one dose of MMR if you were born in 1957 or later. You may also need a second dose.  Pneumococcal 13-valent conjugate (PCV13) vaccine. One dose is recommended after age 65.  Pneumococcal polysaccharide (PPSV23) vaccine. One dose is  recommended after age 65.  Meningococcal vaccine. You may need this if you have certain conditions.  Hepatitis A vaccine. You may need this if you have certain conditions or if you travel or work in places where you may be exposed to hepatitis A.  Hepatitis B vaccine. You may need this if you have certain conditions or if you travel or work in places where you may be exposed to hepatitis B.  Haemophilus influenzae type b (Hib) vaccine. You may need this if you have certain conditions.  Talk to your health care provider about which screenings and vaccines you need and how often you need them. This information is not intended to replace advice given to you by your health care provider. Make sure you discuss any questions you have with your health care provider. Document Released: 08/19/2015 Document Revised: 04/11/2016 Document Reviewed: 05/24/2015 Elsevier Interactive Patient Education  2018 Elsevier Inc.   

## 2017-09-03 NOTE — Addendum Note (Signed)
Addended by: Tawnya CrookSAMBATH, Nghia Mcentee on: 09/03/2017 09:23 AM   Modules accepted: Orders

## 2017-09-03 NOTE — Addendum Note (Signed)
Addended by: Tawnya CrookSAMBATH, Meril Dray on: 09/03/2017 09:19 AM   Modules accepted: Orders

## 2017-09-03 NOTE — Progress Notes (Signed)
Subjective:    Patient ID: Raven Campos, female    DOB: [birth year], 67 y.o.   MRN: [id number]  HPI  Ms. Raven Campos is a 67 year old female who presents today for complete physical.  Immunizations: -Tetanus: Completed in 2018 -Influenza: Completed this season -Pneumonia: Due today. -Shingles: Completed in 2016  Diet: She endorses a poor diet. Breakfast: Oatmeal, cereal Lunch: Skips sometimes, yogurt, protein bar Dinner: Meat, vegetable, starch  Snacks: Candy, peanut butter balls, cheese/nuts Desserts: Daily Beverages: Coffee, water, little soda  Exercise: She is not currently exercising. Will start Yoga next month.  Eye exam: Completed in Spring 2018. Dental exam: Completes semi-annually Colonoscopy: Cologuard negative in 2018 Dexa: Osteopenia in 2016 Pap Smear: Completed in 2016 Mammogram: Scheduled for this week. Hep C Screen: Negative in 2018    Review of Systems  Constitutional: Negative for unexpected weight change.  HENT: Negative for rhinorrhea.   Respiratory: Negative for cough and shortness of breath.   Cardiovascular: Negative for chest pain.  Gastrointestinal: Negative for constipation and diarrhea.  Genitourinary: Negative for difficulty urinating and menstrual problem.  Musculoskeletal: Negative for arthralgias and myalgias.  Skin: Negative for rash.  Allergic/Immunologic: Negative for environmental allergies.  Neurological: Negative for dizziness, numbness and headaches.  Psychiatric/Behavioral: The patient is not nervous/anxious.        Past Medical History:  Diagnosis Date  . Hyperlipidemia   . Obesity   . Stress incontinence      Social History   Socioeconomic History  . Marital status: Married    Spouse name: Not on file  . Number of children: Not on file  . Years of education: Not on file  . Highest education level: Not on file  Social Needs  . Financial resource strain: Not on file  . Food insecurity - worry: Not on file  . Food  insecurity - inability: Not on file  . Transportation needs - medical: Not on file  . Transportation needs - non-medical: Not on file  Occupational History  . Not on file  Tobacco Use  . Smoking status: Never Smoker  . Smokeless tobacco: Never Used  Substance and Sexual Activity  . Alcohol use: Yes    Alcohol/week: 0.0 oz    Comment: occ  . Drug use: No  . Sexual activity: Not on file  Other Topics Concern  . Not on file  Social History Narrative   Married.   Highest level of education: Associates degree in Nursing.   Works at [employer].   Enjoys gardening, yard work, sewing, working puzzles.    No past surgical history on file.  Family History  Problem Relation Age of Onset  . Cancer Other   . Obesity Other   . Hypertension Other   . Arthritis Mother   . Hyperlipidemia Mother   . Hypertension Mother   . Cancer Father        lung    Allergies  Allergen Reactions  . Bee Venom     Current Outpatient Medications on File Prior to Visit  Medication Sig Dispense Refill  . aspirin 81 MG tablet Take 81 mg by mouth daily.    . Calcium Carb-Cholecalciferol (CALCIUM 600+D3) 600-800 MG-UNIT TABS     . cetirizine (ZYRTEC) 10 MG tablet Take 1 tablet (10 mg total) by mouth daily. One tab daily for allergies 30 tablet 1  . Cyanocobalamin (VITAMIN B 12 PO) Take by mouth.    . dextromethorphan-guaiFENesin (MUCINEX DM) 30-600 MG  12hr tablet Take 1 tablet by mouth 2 (two) times daily as needed for cough.    . GuaiFENesin (MUCINEX PO)     . Misc Natural Products (TUMERSAID PO)     . EPINEPHrine (EPIPEN 2-PAK) 0.3 mg/0.3 mL IJ SOAJ injection Inject 0.3 mLs (0.3 mg total) into the muscle once. (Patient not taking: Reported on 08/12/2017) 1 Device 1  . fluticasone (FLONASE) 50 MCG/ACT nasal spray Place 2 sprays into both nostrils daily for 10 days. (Patient not taking: Reported on 09/03/2017) 16 g 0   No current facility-administered medications on file prior to  visit.     BP 136/84   Pulse 81   Temp 98.1 F (36.7 C) (Oral)   Ht 5\' 2"  (1.575 m)   Wt 203 lb 12.8 oz (92.4 kg)   LMP 08/01/2013   SpO2 96%   BMI 37.28 kg/m    Objective:   Physical Exam  Constitutional: She is oriented to person, place, and time. She appears well-nourished.  HENT:  Right Ear: Tympanic membrane and ear canal normal.  Left Ear: Tympanic membrane and ear canal normal.  Nose: Nose normal.  Mouth/Throat: Oropharynx is clear and moist.  Eyes: Conjunctivae and EOM are normal. Pupils are equal, round, and reactive to light.  Neck: Neck supple. No thyromegaly present.  Cardiovascular: Normal rate and regular rhythm.  No murmur heard. Pulmonary/Chest: Effort normal and breath sounds normal. She has no rales.  Abdominal: Soft. Bowel sounds are normal. There is no tenderness.  Musculoskeletal: Normal range of motion.  Lymphadenopathy:    She has no cervical adenopathy.  Neurological: She is alert and oriented to person, place, and time. She has normal reflexes. No cranial nerve deficit.  Skin: Skin is warm and dry. No rash noted.  Psychiatric: She has a normal mood and affect.          Assessment & Plan:

## 2017-09-03 NOTE — Assessment & Plan Note (Signed)
Repeat lipid panel pending.  Discussed the importance of a healthy diet and regular exercise in order for weight loss, and to reduce the risk of any potential medical problems.  

## 2017-09-05 ENCOUNTER — Ambulatory Visit
Admission: RE | Admit: 2017-09-05 | Discharge: 2017-09-05 | Disposition: A | Discharge status: Home / Self Care | Payer: 59 | Source: Ambulatory Visit | Attending: Primary Care | Admitting: Primary Care

## 2017-09-05 DIAGNOSIS — Z1231 Encounter for screening mammogram for malignant neoplasm of breast: Secondary | ICD-10-CM | POA: Diagnosis not present

## 2017-09-17 ENCOUNTER — Ambulatory Visit
Admission: RE | Admit: 2017-09-17 | Discharge: 2017-09-17 | Disposition: A | Discharge status: Home / Self Care | Payer: 59 | Source: Ambulatory Visit | Attending: Primary Care | Admitting: Primary Care

## 2017-09-17 DIAGNOSIS — M85852 Other specified disorders of bone density and structure, left thigh: Secondary | ICD-10-CM | POA: Diagnosis not present

## 2017-09-17 DIAGNOSIS — Z780 Asymptomatic menopausal state: Secondary | ICD-10-CM | POA: Diagnosis not present

## 2017-09-17 DIAGNOSIS — E2839 Other primary ovarian failure: Secondary | ICD-10-CM

## 2017-10-03 ENCOUNTER — Encounter: Payer: Self-pay | Admitting: Primary Care

## 2017-10-03 DIAGNOSIS — M8580 Other specified disorders of bone density and structure, unspecified site: Secondary | ICD-10-CM

## 2017-10-04 MED ORDER — IBANDRONATE SODIUM 150 MG PO TABS: ORAL_TABLET | ORAL | 3 refills | Status: DC

## 2017-10-04 MED FILL — IBANDRONATE NA 150 MG TAB: 150 | 28 days supply | Qty: 1 | Fill #0

## 2017-11-05 MED FILL — IBANDRONATE NA 150 MG TAB: 150 | 84 days supply | Qty: 3 | Fill #1

## 2018-01-15 ENCOUNTER — Encounter: Payer: Self-pay | Admitting: Primary Care

## 2018-01-16 NOTE — Telephone Encounter (Signed)
Pt is requesting July labs, orders are in from Jan 2019. OK to schedule?

## 2018-01-17 ENCOUNTER — Other Ambulatory Visit: Payer: Self-pay | Admitting: Primary Care

## 2018-02-18 ENCOUNTER — Other Ambulatory Visit (INDEPENDENT_AMBULATORY_CARE_PROVIDER_SITE_OTHER): Payer: 59

## 2018-02-18 DIAGNOSIS — R739 Hyperglycemia, unspecified: Secondary | ICD-10-CM | POA: Diagnosis not present

## 2018-02-18 DIAGNOSIS — E785 Hyperlipidemia, unspecified: Secondary | ICD-10-CM

## 2018-02-18 LAB — LIPID PANEL
Cholesterol: 207 mg/dL — ABNORMAL HIGH (ref 0–200)
HDL: 54.5 mg/dL (ref >39.00)
LDL CALC: 130 mg/dL — AB (ref 0–99)
NonHDL: 152.24
TRIGLYCERIDES: 109 mg/dL (ref 0.0–149.0)
Total CHOL/HDL Ratio: 4
VLDL: 21.8 mg/dL (ref 0.0–40.0)

## 2018-02-18 LAB — HEMOGLOBIN A1C: HEMOGLOBIN A1C: 6 % (ref 4.6–6.5)

## 2018-03-20 DIAGNOSIS — H25812 Combined forms of age-related cataract, left eye: Secondary | ICD-10-CM | POA: Diagnosis not present

## 2018-03-20 DIAGNOSIS — H31091 Other chorioretinal scars, right eye: Secondary | ICD-10-CM | POA: Diagnosis not present

## 2018-03-20 DIAGNOSIS — H2511 Age-related nuclear cataract, right eye: Secondary | ICD-10-CM | POA: Diagnosis not present

## 2018-03-20 DIAGNOSIS — H04123 Dry eye syndrome of bilateral lacrimal glands: Secondary | ICD-10-CM | POA: Diagnosis not present

## 2018-03-24 MED FILL — IBANDRONATE NA 150 MG TAB: 150 | 84 days supply | Qty: 3 | Fill #2

## 2018-05-30 NOTE — Telephone Encounter (Signed)
Irving Burton, will you get her scheduled for CPE next year?

## 2018-06-03 ENCOUNTER — Other Ambulatory Visit: Payer: Self-pay | Admitting: Primary Care

## 2018-06-03 DIAGNOSIS — Z1231 Encounter for screening mammogram for malignant neoplasm of breast: Secondary | ICD-10-CM

## 2018-09-05 ENCOUNTER — Other Ambulatory Visit: Payer: Self-pay | Admitting: Primary Care

## 2018-09-05 DIAGNOSIS — E559 Vitamin D deficiency, unspecified: Secondary | ICD-10-CM

## 2018-09-05 DIAGNOSIS — E785 Hyperlipidemia, unspecified: Secondary | ICD-10-CM

## 2018-09-05 DIAGNOSIS — R7303 Prediabetes: Secondary | ICD-10-CM

## 2018-09-08 ENCOUNTER — Ambulatory Visit
Admission: RE | Admit: 2018-09-08 | Discharge: 2018-09-08 | Disposition: A | Discharge status: Home / Self Care | Payer: 59 | Source: Ambulatory Visit | Attending: Primary Care | Admitting: Primary Care

## 2018-09-08 DIAGNOSIS — Z1231 Encounter for screening mammogram for malignant neoplasm of breast: Secondary | ICD-10-CM

## 2018-09-09 ENCOUNTER — Other Ambulatory Visit (INDEPENDENT_AMBULATORY_CARE_PROVIDER_SITE_OTHER): Payer: 59

## 2018-09-09 DIAGNOSIS — E785 Hyperlipidemia, unspecified: Secondary | ICD-10-CM

## 2018-09-09 DIAGNOSIS — R7303 Prediabetes: Secondary | ICD-10-CM | POA: Diagnosis not present

## 2018-09-09 DIAGNOSIS — E559 Vitamin D deficiency, unspecified: Secondary | ICD-10-CM | POA: Diagnosis not present

## 2018-09-09 LAB — COMPREHENSIVE METABOLIC PANEL
ALBUMIN: 4 g/dL (ref 3.5–5.2)
ALT: 25 U/L (ref 0–35)
AST: 21 U/L (ref 0–37)
Alkaline Phosphatase: 53 U/L (ref 39–117)
BUN: 16 mg/dL (ref 6–23)
CO2: 32 mEq/L (ref 19–32)
Calcium: 9.2 mg/dL (ref 8.4–10.5)
Chloride: 104 mEq/L (ref 96–112)
Creatinine, Ser: 0.7 mg/dL (ref 0.40–1.20)
GFR: 83.62 mL/min (ref >60.00)
Glucose, Bld: 96 mg/dL (ref 70–99)
Potassium: 4.2 mEq/L (ref 3.5–5.1)
SODIUM: 142 meq/L (ref 135–145)
Total Bilirubin: 0.6 mg/dL (ref 0.2–1.2)
Total Protein: 6.6 g/dL (ref 6.0–8.3)

## 2018-09-09 LAB — LIPID PANEL
Cholesterol: 208 mg/dL — ABNORMAL HIGH (ref 0–200)
HDL: 48.4 mg/dL (ref >39.00)
LDL Cholesterol: 131 mg/dL — ABNORMAL HIGH (ref 0–99)
NonHDL: 159.48
Total CHOL/HDL Ratio: 4
Triglycerides: 140 mg/dL (ref 0.0–149.0)
VLDL: 28 mg/dL (ref 0.0–40.0)

## 2018-09-09 LAB — HEMOGLOBIN A1C: Hgb A1c MFr Bld: 5.9 % (ref 4.6–6.5)

## 2018-09-09 LAB — VITAMIN D 25 HYDROXY (VIT D DEFICIENCY, FRACTURES): VITD: 58.13 ng/mL (ref 30.00–100.00)

## 2018-09-16 ENCOUNTER — Encounter: Payer: Self-pay | Admitting: Primary Care

## 2018-09-16 ENCOUNTER — Ambulatory Visit (INDEPENDENT_AMBULATORY_CARE_PROVIDER_SITE_OTHER): Payer: 59 | Admitting: Primary Care

## 2018-09-16 VITALS — BP 124/76 | HR 71 | Temp 98.1°F | Ht 60.5 in | Wt 162.0 lb

## 2018-09-16 DIAGNOSIS — M8580 Other specified disorders of bone density and structure, unspecified site: Secondary | ICD-10-CM | POA: Diagnosis not present

## 2018-09-16 DIAGNOSIS — R7303 Prediabetes: Secondary | ICD-10-CM | POA: Insufficient documentation

## 2018-09-16 DIAGNOSIS — E785 Hyperlipidemia, unspecified: Secondary | ICD-10-CM

## 2018-09-16 DIAGNOSIS — R030 Elevated blood-pressure reading, without diagnosis of hypertension: Secondary | ICD-10-CM | POA: Diagnosis not present

## 2018-09-16 DIAGNOSIS — Z0000 Encounter for general adult medical examination without abnormal findings: Secondary | ICD-10-CM

## 2018-09-16 NOTE — Assessment & Plan Note (Signed)
Chronic, overall stable. Commended her on weight loss, encouraged her to continue.  Repeat in one year.

## 2018-09-16 NOTE — Assessment & Plan Note (Signed)
Stable over the last several years. Commended her on weight loss over the last one year, encouraged her to continue with healthy diet and start regular exercise. ASCVD risk score of 6.2%, discussed risks of CVD/Stroke moving forward. Given her success with weight loss, we will have her continue to work on lifestyle changes moving forward. Consider statin in the future if warranted.

## 2018-09-16 NOTE — Addendum Note (Signed)
Addended by: Tawnya Crook on: 09/16/2018 09:01 AM   Modules accepted: Orders

## 2018-09-16 NOTE — Assessment & Plan Note (Signed)
Normal in the office today, continue to monitor.  

## 2018-09-16 NOTE — Assessment & Plan Note (Signed)
Immunizations UTD. Mammogram UTD. Colon cancer screening UTD, due in 2021. Bone density scan UTD, due in 2021. Commended her on weight loss, encouraged to work on regular exercise. Exam unremarkable. Labs reviewed. Follow up in 1 year for CPE.

## 2018-09-16 NOTE — Assessment & Plan Note (Addendum)
Could not tolerate Boniva due to arthralgias. She is compliant to calcium and vitamin D daily, continue same. Continue off of Boniva for now. Discussed importance of weight bearing exercise. Repeat bone density in 2021.

## 2018-09-16 NOTE — Patient Instructions (Signed)
Start exercising. You should be getting 150 minutes of moderate intensity exercise weekly.  Continue to work on weight loss through your diet, congratulations on your weight loss thus far!  Ensure you are consuming 64 ounces of water daily.  We will repeat your bone density scan and colon cancer screening next year.  We will see you in one year for your annual exam or sooner if needed.  It was a pleasure to see you today!   Preventive Care 77 Years and Older, Female Preventive care refers to lifestyle choices and visits with your health care provider that can promote health and wellness. What does preventive care include?  A yearly physical exam. This is also called an annual well check.  Dental exams once or twice a year.  Routine eye exams. Ask your health care provider how often you should have your eyes checked.  Personal lifestyle choices, including: ? Daily care of your teeth and gums. ? Regular physical activity. ? Eating a healthy diet. ? Avoiding tobacco and drug use. ? Limiting alcohol use. ? Practicing safe sex. ? Taking low-dose aspirin every day. ? Taking vitamin and mineral supplements as recommended by your health care provider. What happens during an annual well check? The services and screenings done by your health care provider during your annual well check will depend on your age, overall health, lifestyle risk factors, and family history of disease. Counseling Your health care provider may ask you questions about your:  Alcohol use.  Tobacco use.  Drug use.  Emotional well-being.  Home and relationship well-being.  Sexual activity.  Eating habits.  History of falls.  Memory and ability to understand (cognition).  Work and work Statistician.  Reproductive health.  Screening You may have the following tests or measurements:  Height, weight, and BMI.  Blood pressure.  Lipid and cholesterol levels. These may be checked every 5 years, or  more frequently if you are over 67 years old.  Skin check.  Lung cancer screening. You may have this screening every year starting at age 80 if you have a 30-pack-year history of smoking and currently smoke or have quit within the past 15 years.  Colorectal cancer screening. All adults should have this screening starting at age 1 and continuing until age 54. You will have tests every 1-10 years, depending on your results and the type of screening test. People at increased risk should start screening at an earlier age. Screening tests may include: ? Guaiac-based fecal occult blood testing. ? Fecal immunochemical test (FIT). ? Stool DNA test. ? Virtual colonoscopy. ? Sigmoidoscopy. During this test, a flexible tube with a tiny camera (sigmoidoscope) is used to examine your rectum and lower colon. The sigmoidoscope is inserted through your anus into your rectum and lower colon. ? Colonoscopy. During this test, a long, thin, flexible tube with a tiny camera (colonoscope) is used to examine your entire colon and rectum.  Hepatitis C blood test.  Hepatitis B blood test.  Sexually transmitted disease (STD) testing.  Diabetes screening. This is done by checking your blood sugar (glucose) after you have not eaten for a while (fasting). You may have this done every 1-3 years.  Bone density scan. This is done to screen for osteoporosis. You may have this done starting at age 27.  Mammogram. This may be done every 1-2 years. Talk to your health care provider about how often you should have regular mammograms. Talk with your health care provider about your test results, treatment  options, and if necessary, the need for more tests. Vaccines Your health care provider may recommend certain vaccines, such as:  Influenza vaccine. This is recommended every year.  Tetanus, diphtheria, and acellular pertussis (Tdap, Td) vaccine. You may need a Td booster every 10 years.  Varicella vaccine. You may need  this if you have not been vaccinated.  Zoster vaccine. You may need this after age 69.  Measles, mumps, and rubella (MMR) vaccine. You may need at least one dose of MMR if you were born in 1957 or later. You may also need a second dose.  Pneumococcal 13-valent conjugate (PCV13) vaccine. One dose is recommended after age 91.  Pneumococcal polysaccharide (PPSV23) vaccine. One dose is recommended after age 54.  Meningococcal vaccine. You may need this if you have certain conditions.  Hepatitis A vaccine. You may need this if you have certain conditions or if you travel or work in places where you may be exposed to hepatitis A.  Hepatitis B vaccine. You may need this if you have certain conditions or if you travel or work in places where you may be exposed to hepatitis B.  Haemophilus influenzae type b (Hib) vaccine. You may need this if you have certain conditions. Talk to your health care provider about which screenings and vaccines you need and how often you need them. This information is not intended to replace advice given to you by your health care provider. Make sure you discuss any questions you have with your health care provider. Document Released: 08/19/2015 Document Revised: 09/12/2017 Document Reviewed: 05/24/2015 Elsevier Interactive Patient Education  2019 [employer].

## 2018-09-16 NOTE — Progress Notes (Signed)
Subjective:    Patient ID: Raven Campos, female    DOB: 19-Jan-1951, 69 y.o.   MRN: [id number]  HPI  Raven Campos is a 68 year old female who presents today for complete physical.  Immunizations: -Tetanus: Completed in 2018 -Influenza: Completed this season  -Pneumonia: Completed in 2019 -Shingles: Completed in 2016   Diet: She endorses a fair diet Breakfast: Cereal, protein bar Lunch: Protein drink, left overs, salad with crackers Dinner: Meat, vegetable, starch, salad Snacks: Veggies, cheese, fruit, peanut butter Desserts: 2-3 times weekly  Beverages: Water, occasional soda and soda, infrequently wine  Exercise: She is not exercising regularly  Eye exam: Completed in August 2019 Dental exam: Completes semi-annually  Colonoscopy: Completed Cologuard in 2018, due in 2021 Dexa: Completed in 2019, osteopenia  Mammogram: Completed in February 2020 Hep C Screen: Completed  Wt Readings from Last 3 Encounters:  09/16/18 162 lb (73.5 kg)  09/03/17 203 lb 12.8 oz (92.4 kg)  08/12/17 203 lb 6.4 oz (92.3 kg)   BP Readings from Last 3 Encounters:  09/16/18 124/76  09/03/17 136/84  08/12/17 138/86   The 10-year ASCVD risk score (Goff DC Jr., et al., 2013) is: 6.2%   Values used to calculate the score:     Age: 48 years     Sex: Female     Is Non-Hispanic African American: No     Diabetic: No     Tobacco smoker: No     Systolic Blood Pressure: 124 mmHg     Is BP treated: No     HDL Cholesterol: 48.4 mg/dL     Total Cholesterol: 208 mg/dL    Review of Systems  Constitutional: Negative for unexpected weight change.  HENT: Negative for rhinorrhea.   Respiratory: Negative for cough and shortness of breath.   Cardiovascular: Negative for chest pain.  Gastrointestinal: Negative for constipation and diarrhea.  Genitourinary: Negative for difficulty urinating.  Musculoskeletal: Positive for arthralgias.       Chronic left knee pain and lower back pain. Overall stable.    Skin: Negative for rash.  Allergic/Immunologic: Negative for environmental allergies.  Neurological: Negative for dizziness, numbness and headaches.  Psychiatric/Behavioral: The patient is not nervous/anxious.        Past Medical History:  Diagnosis Date  . Hyperlipidemia   . Obesity   . Stress incontinence      Social History   Socioeconomic History  . Marital status: Married    Spouse name: Not on file  . Number of children: Not on file  . Years of education: Not on file  . Highest education level: Not on file  Occupational History  . Not on file  Social Needs  . Financial resource strain: Not on file  . Food insecurity:    Worry: Not on file    Inability: Not on file  . Transportation needs:    Medical: Not on file    Non-medical: Not on file  Tobacco Use  . Smoking status: Never Smoker  . Smokeless tobacco: Never Used  Substance and Sexual Activity  . Alcohol use: Yes    Alcohol/week: 0.0 standard drinks    Comment: occ  . Drug use: No  . Sexual activity: Not on file  Lifestyle  . Physical activity:    Days per week: Not on file    Minutes per session: Not on file  . Stress: Not on file  Relationships  . Social connections:    Talks on phone: Not on file  Gets together: Not on file    Attends religious service: Not on file    Active member of club or organization: Not on file    Attends meetings of clubs or organizations: Not on file    Relationship status: Not on file  . Intimate partner violence:    Fear of current or ex partner: Not on file    Emotionally abused: Not on file    Physically abused: Not on file    Forced sexual activity: Not on file  Other Topics Concern  . Not on file  Social History Narrative   Married.   Highest level of education: Associates degree in Nursing.   Works at [employer].   Enjoys gardening, yard work, sewing, working puzzles.    No past surgical history on file.  Family History   Problem Relation Age of Onset  . Arthritis Mother   . Hyperlipidemia Mother   . Hypertension Mother   . Cancer Father        lung  . Cancer Other   . Obesity Other   . Hypertension Other     Allergies  Allergen Reactions  . Bee Venom Nausea And Vomiting    Current Outpatient Medications on File Prior to Visit  Medication Sig Dispense Refill  . aspirin 81 MG tablet Take 81 mg by mouth daily.    . Calcium Carb-Cholecalciferol (CALCIUM 600+D3) 600-800 MG-UNIT TABS     . cetirizine (ZYRTEC) 10 MG tablet Take 1 tablet (10 mg total) by mouth daily. One tab daily for allergies (Patient taking differently: Take 10 mg by mouth as needed. ) 30 tablet 1  . Cholecalciferol (D3 VITAMIN PO) Take 50 mcg by mouth daily.    . Cyanocobalamin (VITAMIN B 12 PO) Take 5,000 mg by mouth daily.     Marland Kitchen. dextromethorphan-guaiFENesin (MUCINEX DM) 30-600 MG 12hr tablet Take 1 tablet by mouth 2 (two) times daily as needed for cough.    . EPINEPHrine (EPIPEN 2-PAK) 0.3 mg/0.3 mL IJ SOAJ injection Inject 0.3 mLs (0.3 mg total) into the muscle once. 1 Device 1  . Misc Natural Products (TUMERSAID PO) Take 500 mg by mouth daily.     . fluticasone (FLONASE) 50 MCG/ACT nasal spray Place 2 sprays into both nostrils daily for 10 days. 16 g 0   No current facility-administered medications on file prior to visit.     BP 124/76   Pulse 71   Temp 98.1 F (36.7 C) (Oral)   Ht 5' 0.5" (1.537 m)   Wt 162 lb (73.5 kg)   LMP 08/01/2013   SpO2 95%   BMI 31.12 kg/m    Objective:   Physical Exam  Constitutional: She is oriented to person, place, and time. She appears well-nourished.  HENT:  Mouth/Throat: No oropharyngeal exudate.  Eyes: Pupils are equal, round, and reactive to light. EOM are normal.  Neck: Neck supple. No thyromegaly present.  Cardiovascular: Normal rate and regular rhythm.  Respiratory: Effort normal and breath sounds normal.  GI: Soft. Bowel sounds are normal. There is no abdominal tenderness.   Musculoskeletal: Normal range of motion.  Neurological: She is alert and oriented to person, place, and time.  Skin: Skin is warm and dry.  Psychiatric: She has a normal mood and affect.           Assessment & Plan:

## 2019-03-30 DIAGNOSIS — H31091 Other chorioretinal scars, right eye: Secondary | ICD-10-CM | POA: Diagnosis not present

## 2019-03-30 DIAGNOSIS — H04123 Dry eye syndrome of bilateral lacrimal glands: Secondary | ICD-10-CM | POA: Diagnosis not present

## 2019-03-30 DIAGNOSIS — H25812 Combined forms of age-related cataract, left eye: Secondary | ICD-10-CM | POA: Diagnosis not present

## 2019-03-30 DIAGNOSIS — H2511 Age-related nuclear cataract, right eye: Secondary | ICD-10-CM | POA: Diagnosis not present

## 2019-03-30 DIAGNOSIS — H43811 Vitreous degeneration, right eye: Secondary | ICD-10-CM | POA: Diagnosis not present

## 2019-05-28 DIAGNOSIS — H43811 Vitreous degeneration, right eye: Secondary | ICD-10-CM | POA: Diagnosis not present

## 2019-08-11 ENCOUNTER — Other Ambulatory Visit: Payer: Self-pay | Admitting: Primary Care

## 2019-08-11 DIAGNOSIS — Z1231 Encounter for screening mammogram for malignant neoplasm of breast: Secondary | ICD-10-CM

## 2019-09-15 ENCOUNTER — Other Ambulatory Visit: Payer: Self-pay

## 2019-09-15 ENCOUNTER — Ambulatory Visit
Admission: RE | Admit: 2019-09-15 | Discharge: 2019-09-15 | Disposition: A | Discharge status: Home / Self Care | Payer: 59 | Source: Ambulatory Visit | Attending: Primary Care | Admitting: Primary Care

## 2019-09-15 DIAGNOSIS — Z1231 Encounter for screening mammogram for malignant neoplasm of breast: Secondary | ICD-10-CM

## 2019-09-15 NOTE — Telephone Encounter (Signed)
error 

## 2019-09-22 ENCOUNTER — Other Ambulatory Visit: Payer: Self-pay

## 2019-09-22 ENCOUNTER — Telehealth: Payer: Self-pay

## 2019-09-22 ENCOUNTER — Ambulatory Visit (INDEPENDENT_AMBULATORY_CARE_PROVIDER_SITE_OTHER): Payer: 59 | Admitting: Primary Care

## 2019-09-22 ENCOUNTER — Encounter: Payer: Self-pay | Admitting: Primary Care

## 2019-09-22 VITALS — BP 126/76 | HR 78 | Temp 96.1°F | Ht 60.5 in | Wt 197.5 lb

## 2019-09-22 DIAGNOSIS — Z0000 Encounter for general adult medical examination without abnormal findings: Secondary | ICD-10-CM

## 2019-09-22 DIAGNOSIS — R030 Elevated blood-pressure reading, without diagnosis of hypertension: Secondary | ICD-10-CM

## 2019-09-22 DIAGNOSIS — Z91030 Bee allergy status: Secondary | ICD-10-CM | POA: Diagnosis not present

## 2019-09-22 DIAGNOSIS — Z1211 Encounter for screening for malignant neoplasm of colon: Secondary | ICD-10-CM

## 2019-09-22 DIAGNOSIS — M8580 Other specified disorders of bone density and structure, unspecified site: Secondary | ICD-10-CM | POA: Diagnosis not present

## 2019-09-22 DIAGNOSIS — R7303 Prediabetes: Secondary | ICD-10-CM

## 2019-09-22 DIAGNOSIS — E785 Hyperlipidemia, unspecified: Secondary | ICD-10-CM

## 2019-09-22 DIAGNOSIS — E559 Vitamin D deficiency, unspecified: Secondary | ICD-10-CM

## 2019-09-22 DIAGNOSIS — Z23 Encounter for immunization: Secondary | ICD-10-CM

## 2019-09-22 LAB — CBC
HCT: 42.3 % (ref 36.0–46.0)
Hemoglobin: 14.4 g/dL (ref 12.0–15.0)
MCHC: 33.9 g/dL (ref 30.0–36.0)
MCV: 90 fl (ref 78.0–100.0)
Platelets: 220 10*3/uL (ref 150.0–400.0)
RBC: 4.7 Mil/uL (ref 3.87–5.11)
RDW: 12.9 % (ref 11.5–15.5)
WBC: 5.8 10*3/uL (ref 4.0–10.5)

## 2019-09-22 LAB — COMPREHENSIVE METABOLIC PANEL
ALT: 22 U/L (ref 0–35)
AST: 20 U/L (ref 0–37)
Albumin: 4 g/dL (ref 3.5–5.2)
Alkaline Phosphatase: 51 U/L (ref 39–117)
BUN: 16 mg/dL (ref 6–23)
CO2: 32 mEq/L (ref 19–32)
Calcium: 9.3 mg/dL (ref 8.4–10.5)
Chloride: 104 mEq/L (ref 96–112)
Creatinine, Ser: 0.75 mg/dL (ref 0.40–1.20)
GFR: 76.98 mL/min (ref >60.00)
Glucose, Bld: 96 mg/dL (ref 70–99)
Potassium: 4.1 mEq/L (ref 3.5–5.1)
Sodium: 141 mEq/L (ref 135–145)
Total Bilirubin: 0.5 mg/dL (ref 0.2–1.2)
Total Protein: 6.8 g/dL (ref 6.0–8.3)

## 2019-09-22 LAB — LIPID PANEL
Cholesterol: 218 mg/dL — ABNORMAL HIGH (ref 0–200)
HDL: 51.4 mg/dL (ref >39.00)
LDL Cholesterol: 138 mg/dL — ABNORMAL HIGH (ref 0–99)
NonHDL: 166.4
Total CHOL/HDL Ratio: 4
Triglycerides: 143 mg/dL (ref 0.0–149.0)
VLDL: 28.6 mg/dL (ref 0.0–40.0)

## 2019-09-22 LAB — HEMOGLOBIN A1C: Hgb A1c MFr Bld: 6 % (ref 4.6–6.5)

## 2019-09-22 LAB — VITAMIN D 25 HYDROXY (VIT D DEFICIENCY, FRACTURES): VITD: 56.89 ng/mL (ref 30.00–100.00)

## 2019-09-22 MED ORDER — EPINEPHRINE 0.3 MG/0.3ML IJ SOAJ: 0.3000 mg | Freq: Once | INTRAMUSCULAR | 0 refills | Status: AC

## 2019-09-22 MED FILL — EPINEPHRINE 0.3 MG AUTO-INJ: 0.3 | 2 days supply | Qty: 2 | Fill #0

## 2019-09-22 NOTE — Assessment & Plan Note (Signed)
Compliant to vitamin D and calcium. Repeat bone density due in 2022. Encouraged weight bearing exercise.

## 2019-09-22 NOTE — Assessment & Plan Note (Signed)
Discussed the importance of a healthy diet and regular exercise in order for weight loss, and to reduce the risk of any potential medical problems.  Repeat lipid panel pending. 

## 2019-09-22 NOTE — Patient Instructions (Signed)
Stop by the lab prior to leaving today. I will notify you of your results once received.   Start exercising. You should be getting 150 minutes of moderate intensity exercise weekly.  It's important to improve your diet by reducing consumption of fast food, fried food, processed snack foods, sugary drinks. Increase consumption of fresh vegetables and fruits, whole grains, water.  Ensure you are drinking 64 ounces of water daily.  Complete the Cologuard Kit once received.  It was a pleasure to see you today!   Preventive Care 68 Years and Older, Female Preventive care refers to lifestyle choices and visits with your health care provider that can promote health and wellness. This includes:  A yearly physical exam. This is also called an annual well check.  Regular dental and eye exams.  Immunizations.  Screening for certain conditions.  Healthy lifestyle choices, such as diet and exercise. What can I expect for my preventive care visit? Physical exam Your health care provider will check:  Height and weight. These may be used to calculate body mass index (BMI), which is a measurement that tells if you are at a healthy weight.  Heart rate and blood pressure.  Your skin for abnormal spots. Counseling Your health care provider may ask you questions about:  Alcohol, tobacco, and drug use.  Emotional well-being.  Home and relationship well-being.  Sexual activity.  Eating habits.  History of falls.  Memory and ability to understand (cognition).  Work and work Statistician.  Pregnancy and menstrual history. What immunizations do I need?  Influenza (flu) vaccine  This is recommended every year. Tetanus, diphtheria, and pertussis (Tdap) vaccine  You may need a Td booster every 10 years. Varicella (chickenpox) vaccine  You may need this vaccine if you have not already been vaccinated. Zoster (shingles) vaccine  You may need this after age 38. Pneumococcal  conjugate (PCV13) vaccine  One dose is recommended after age 104. Pneumococcal polysaccharide (PPSV23) vaccine  One dose is recommended after age 56. Measles, mumps, and rubella (MMR) vaccine  You may need at least one dose of MMR if you were born in 1957 or later. You may also need a second dose. Meningococcal conjugate (MenACWY) vaccine  You may need this if you have certain conditions. Hepatitis A vaccine  You may need this if you have certain conditions or if you travel or work in places where you may be exposed to hepatitis A. Hepatitis B vaccine  You may need this if you have certain conditions or if you travel or work in places where you may be exposed to hepatitis B. Haemophilus influenzae type b (Hib) vaccine  You may need this if you have certain conditions. You may receive vaccines as individual doses or as more than one vaccine together in one shot (combination vaccines). Talk with your health care provider about the risks and benefits of combination vaccines. What tests do I need? Blood tests  Lipid and cholesterol levels. These may be checked every 5 years, or more frequently depending on your overall health.  Hepatitis C test.  Hepatitis B test. Screening  Lung cancer screening. You may have this screening every year starting at age 52 if you have a 30-pack-year history of smoking and currently smoke or have quit within the past 15 years.  Colorectal cancer screening. All adults should have this screening starting at age 16 and continuing until age 36. Your health care provider may recommend screening at age 74 if you are at increased risk.  You will have tests every 1-10 years, depending on your results and the type of screening test.  Diabetes screening. This is done by checking your blood sugar (glucose) after you have not eaten for a while (fasting). You may have this done every 1-3 years.  Mammogram. This may be done every 1-2 years. Talk with your health care  provider about how often you should have regular mammograms.  BRCA-related cancer screening. This may be done if you have a family history of breast, ovarian, tubal, or peritoneal cancers. Other tests  Sexually transmitted disease (STD) testing.  Bone density scan. This is done to screen for osteoporosis. You may have this done starting at age 2. Follow these instructions at home: Eating and drinking  Eat a diet that includes fresh fruits and vegetables, whole grains, lean protein, and low-fat dairy products. Limit your intake of foods with high amounts of sugar, saturated fats, and salt.  Take vitamin and mineral supplements as recommended by your health care provider.  Do not drink alcohol if your health care provider tells you not to drink.  If you drink alcohol: ? Limit how much you have to 0-1 drink a day. ? Be aware of how much alcohol is in your drink. In the [place] one drink equals one 12 oz bottle of beer (355 mL), one 5 oz glass of wine (148 mL), or one 1 oz glass of hard liquor (44 mL). Lifestyle  Take daily care of your teeth and gums.  Stay active. Exercise for at least 30 minutes on 5 or more days each week.  Do not use any products that contain nicotine or tobacco, such as cigarettes, e-cigarettes, and chewing tobacco. If you need help quitting, ask your health care provider.  If you are sexually active, practice safe sex. Use a condom or other form of protection in order to prevent STIs (sexually transmitted infections).  Talk with your health care provider about taking a low-dose aspirin or statin. What's next?  Go to your health care provider once a year for a well check visit.  Ask your health care provider how often you should have your eyes and teeth checked.  Stay up to date on all vaccines. This information is not intended to replace advice given to you by your health care provider. Make sure you discuss any questions you have with your health care  provider. Document Revised: 07/17/2018 Document Reviewed: 07/17/2018 Elsevier Patient Education  2020 [employer].

## 2019-09-22 NOTE — Assessment & Plan Note (Signed)
Compliant to vitamin D and calcium, continue same. Repeat level pending.

## 2019-09-22 NOTE — Addendum Note (Signed)
Addended by: Tawnya Crook on: 09/22/2019 03:40 PM   Modules accepted: Orders

## 2019-09-22 NOTE — Telephone Encounter (Signed)
Raven Campos at [facility] employee pharmacy left v/m that received an rx for epipen with qty 0.3 ml which is one pen. epipens come in a pack of 2 pens. Raven Campos changed qty to 2 pens. If this is not OK or further questions call [facility] outpt pharmacy and if OK please adjust pts med list. [name] to [facility] CMA.

## 2019-09-22 NOTE — Assessment & Plan Note (Signed)
Immunizations UTD, first Shringirx vaccine provided today. Mammogram UTD. Bone density due in 2022. Colon cancer screening due, orders placed for Cologuard.  Encouraged a healthy diet, regular exercise. Exam today unremarkable. Labs pending.

## 2019-09-22 NOTE — Progress Notes (Signed)
Subjective:    Patient ID: Raven Campos, female    DOB: 1950/08/21, 69 y.o.   MRN: [id number]  HPI  This visit occurred during the SARS-CoV-2 public health emergency.  Safety protocols were in place, including screening questions prior to the visit, additional usage of staff PPE, and extensive cleaning of exam room while observing appropriate contact time as indicated for disinfecting solutions.   Raven Campos is a 69 year old female who presents today for complete physical.  Immunizations: -Tetanus: Completed in 2018 -Influenza: Completed this season  -Shingles: Completed Zostavax in 2016 -Pneumonia: Prevnar in 2019, Pneumovax due  Diet: She endorses a poor diet, snacking more frequently.  Exercise: She is not exercising.   Eye exam: Completed in 2020 Dental exam: Completes semi-annually   Mammogram: Completed in 2021 Dexa: Completed in 2019. Compliant to calcium and vitamin D. Colonoscopy: Completed Cologuard in 2018, due. Due again. Hep C Screen: Negative  BP Readings from Last 3 Encounters:  09/22/19 126/76  09/16/18 124/76  09/03/17 136/84   Wt Readings from Last 3 Encounters:  09/22/19 197 lb 8 oz (89.6 kg)  09/16/18 162 lb (73.5 kg)  09/03/17 203 lb 12.8 oz (92.4 kg)      Review of Systems  Constitutional: Negative for unexpected weight change.  HENT: Negative for rhinorrhea.   Respiratory: Negative for cough and shortness of breath.   Cardiovascular: Negative for chest pain.  Gastrointestinal: Negative for constipation and diarrhea.  Genitourinary: Negative for difficulty urinating.  Musculoskeletal: Negative for arthralgias.  Skin: Negative for rash.  Allergic/Immunologic: Negative for environmental allergies.  Neurological: Negative for dizziness, numbness and headaches.  Psychiatric/Behavioral: The patient is not nervous/anxious.        Past Medical History:  Diagnosis Date  . Hyperlipidemia   . Obesity   . Stress incontinence      Social  History   Socioeconomic History  . Marital status: Married    Spouse name: Not on file  . Number of children: Not on file  . Years of education: Not on file  . Highest education level: Not on file  Occupational History  . Not on file  Tobacco Use  . Smoking status: Never Smoker  . Smokeless tobacco: Never Used  Substance and Sexual Activity  . Alcohol use: Yes    Alcohol/week: 0.0 standard drinks    Comment: occ  . Drug use: No  . Sexual activity: Not on file  Other Topics Concern  . Not on file  Social History Narrative   Married.   Highest level of education: Associates degree in Nursing.   Works at [employer].   Enjoys gardening, yard work, sewing, working puzzles.   Social Determinants of Health   Financial Resource Strain:   . Difficulty of Paying Living Expenses: Not on file  Food Insecurity:   . Worried About Programme researcher, broadcasting/film/video in the Last Year: Not on file  . Ran Out of Food in the Last Year: Not on file  Transportation Needs:   . Lack of Transportation (Medical): Not on file  . Lack of Transportation (Non-Medical): Not on file  Physical Activity:   . Days of Exercise per Week: Not on file  . Minutes of Exercise per Session: Not on file  Stress:   . Feeling of Stress : Not on file  Social Connections:   . Frequency of Communication with Friends and Family: Not on file  . Frequency of Social Gatherings with Friends and  Family: Not on file  . Attends Religious Services: Not on file  . Active Member of Clubs or Organizations: Not on file  . Attends Archivist Meetings: Not on file  . Marital Status: Not on file  Intimate Partner Violence:   . Fear of Current or Ex-Partner: Not on file  . Emotionally Abused: Not on file  . Physically Abused: Not on file  . Sexually Abused: Not on file    No past surgical history on file.  Family History  Problem Relation Age of Onset  . Arthritis Mother   . Hyperlipidemia Mother   .  Hypertension Mother   . Cancer Father        lung  . Cancer Other   . Obesity Other   . Hypertension Other     Allergies  Allergen Reactions  . Bee Venom Nausea And Vomiting    Current Outpatient Medications on File Prior to Visit  Medication Sig Dispense Refill  . aspirin 81 MG tablet Take 81 mg by mouth daily.    . Calcium Carb-Cholecalciferol (CALCIUM 600+D3) 600-800 MG-UNIT TABS     . cetirizine (ZYRTEC) 10 MG tablet Take 1 tablet by mouth as needed for allergies    . Cholecalciferol (D3 VITAMIN PO) Take 50 mcg by mouth daily.    . Cyanocobalamin (VITAMIN B 12 PO) Take 5,000 mg by mouth daily.     . Misc Natural Products (TUMERSAID PO) Take 500 mg by mouth daily.      No current facility-administered medications on file prior to visit.    BP 126/76   Pulse 78   Temp (!) 96.1 F (35.6 C) (Temporal)   Ht 5' 0.5" (1.537 m)   Wt 197 lb 8 oz (89.6 kg)   LMP 08/01/2013   SpO2 98%   BMI 37.94 kg/m    Objective:   Physical Exam  Constitutional: She is oriented to person, place, and time. She appears well-nourished.  HENT:  Right Ear: Tympanic membrane and ear canal normal.  Left Ear: Tympanic membrane and ear canal normal.  Mouth/Throat: Oropharynx is clear and moist.  Eyes: Pupils are equal, round, and reactive to light. EOM are normal.  Cardiovascular: Normal rate and regular rhythm.  Respiratory: Effort normal and breath sounds normal.  GI: Soft. Bowel sounds are normal. There is no abdominal tenderness.  Musculoskeletal:        General: Normal range of motion.     Cervical back: Neck supple.  Neurological: She is alert and oriented to person, place, and time. No cranial nerve deficit.  Reflex Scores:      Patellar reflexes are 2+ on the right side and 2+ on the left side. Skin: Skin is warm and dry.  Psychiatric: She has a normal mood and affect.           Assessment & Plan:

## 2019-09-22 NOTE — Assessment & Plan Note (Signed)
Stable in the office today, continue to monitor.  

## 2019-09-22 NOTE — Telephone Encounter (Signed)
Noted. Updated current medication list.

## 2019-09-22 NOTE — Assessment & Plan Note (Signed)
Discussed the importance of a healthy diet and regular exercise in order for weight loss, and to reduce the risk of any potential medical problems.  Repeat A1C pending. 

## 2019-10-07 LAB — COLOGUARD

## 2019-10-15 DIAGNOSIS — Z1212 Encounter for screening for malignant neoplasm of rectum: Secondary | ICD-10-CM | POA: Diagnosis not present

## 2019-10-15 DIAGNOSIS — Z1211 Encounter for screening for malignant neoplasm of colon: Secondary | ICD-10-CM | POA: Diagnosis not present

## 2019-10-15 LAB — COLOGUARD: Cologuard: NEGATIVE

## 2019-10-26 LAB — COLOGUARD: COLOGUARD: NEGATIVE

## 2019-10-27 ENCOUNTER — Encounter: Payer: Self-pay | Admitting: Primary Care

## 2019-12-15 ENCOUNTER — Ambulatory Visit: Payer: 59 | Admitting: Family Medicine

## 2019-12-15 ENCOUNTER — Other Ambulatory Visit: Payer: Self-pay

## 2019-12-15 ENCOUNTER — Encounter: Payer: Self-pay | Admitting: Family Medicine

## 2019-12-15 VITALS — BP 130/78 | HR 75 | Temp 98.6°F | Ht 60.5 in | Wt 202.5 lb

## 2019-12-15 DIAGNOSIS — H60391 Other infective otitis externa, right ear: Secondary | ICD-10-CM | POA: Insufficient documentation

## 2019-12-15 DIAGNOSIS — R591 Generalized enlarged lymph nodes: Secondary | ICD-10-CM | POA: Insufficient documentation

## 2019-12-15 HISTORY — DX: Generalized enlarged lymph nodes: R59.1

## 2019-12-15 MED ORDER — NEOMYCIN-POLYMYXIN-HC 1 % OT SOLN: 3.0000 [drp] | Freq: Four times a day (QID) | OTIC | 0 refills | Status: AC

## 2019-12-15 NOTE — Patient Instructions (Signed)
Compelte 5 -7 days of drops in ear, right.  Add zyrtec or Xyzal at bedtime.  Otitis Externa  Otitis externa is an infection of the outer ear canal. The outer ear canal is the area between the outside of the ear and the eardrum. Otitis externa is sometimes called swimmer's ear. What are the causes? Common causes of this condition include:  Swimming in dirty water.  Moisture in the ear.  An injury to the inside of the ear.  An object stuck in the ear.  A cut or scrape on the outside of the ear. What increases the risk? You are more likely to develop this condition if you go swimming often. What are the signs or symptoms? The first symptom of this condition is often itching in the ear. Later symptoms of the condition include:  Swelling of the ear.  Redness in the ear.  Ear pain. The pain may get worse when you pull on your ear.  Pus coming from the ear. How is this diagnosed? This condition may be diagnosed by examining the ear and testing fluid from the ear for bacteria and funguses. How is this treated? This condition may be treated with:  Antibiotic ear drops. These are often given for 10-14 days.  Medicines to reduce itching and swelling. Follow these instructions at home:  If you were prescribed antibiotic ear drops, use them as told by your health care provider. Do not stop using the antibiotic even if your condition improves.  Take over-the-counter and prescription medicines only as told by your health care provider.  Avoid getting water in your ears as told by your health care provider. This may include avoiding swimming or water sports for a few days.  Keep all follow-up visits as told by your health care provider. This is important. How is this prevented?  Keep your ears dry. Use the corner of a towel to dry your ears after you swim or bathe.  Avoid scratching or putting things in your ear. Doing these things can damage the ear canal or remove the protective  wax that lines it, which makes it easier for bacteria and funguses to grow.  Avoid swimming in lakes, polluted water, or pools that may not have enough chlorine. Contact a health care provider if:  You have a fever.  Your ear is still red, swollen, painful, or draining pus after 3 days.  Your redness, swelling, or pain gets worse.  You have a severe headache.  You have redness, swelling, pain, or tenderness in the area behind your ear. Summary  Otitis externa is an infection of the outer ear canal.  Common causes include swimming in dirty water, moisture in the ear, or a cut or scrape in the ear.  Symptoms include pain, redness, and swelling of the ear.  If you were prescribed antibiotic ear drops, use them as told by your health care provider. Do not stop using the antibiotic even if your condition improves. This information is not intended to replace advice given to you by your health care provider. Make sure you discuss any questions you have with your health care provider. Document Revised: 12/27/2017 Document Reviewed: 12/27/2017 Elsevier Patient Education  2020 [employer].

## 2019-12-15 NOTE — Assessment & Plan Note (Signed)
Treat with otic antibiotic steroid combo. Add antihistamine and continue flonase.

## 2019-12-15 NOTE — Assessment & Plan Note (Signed)
Likely due to right ear chronic infection with recent worsening. IF not resolving in 2 weeks consider re-eval.

## 2019-12-15 NOTE — Progress Notes (Signed)
Chief Complaint  Patient presents with  . Ear Pain    Right x 3 days  . Lymphadenopathy  . Headache    History of Present Illness: HPI  69 year old female patient of Isidoro Donning presents with new onset right ear pain x 3 days, headache and noted swelling in left neck.   She reports she was feeling well until several months agohe has seen clear drainage in right ear.  3 days ago she woke up with swelling in right  ceek and in tonsil area of neck.   Ear pain in last 2 days, constant headache.  No ST.  No fever. No cough.  no nasal congestion.  No SOB.  She has had more sinus issues/allergies in last few months.. using flonase in last few months.   This visit occurred during the SARS-CoV-2 public health emergency.  Safety protocols were in place, including screening questions prior to the visit, additional usage of staff PPE, and extensive cleaning of exam room while observing appropriate contact time as indicated for disinfecting solutions.   COVID 19 screen:  No recent travel or known exposure to COVID19 The patient denies respiratory symptoms of COVID 19 at this time. The importance of social distancing was discussed today.     Review of Systems  Constitutional: Negative for chills and fever.  HENT: Positive for congestion, ear discharge and ear pain.   Eyes: Negative for pain and redness.  Respiratory: Negative for cough and shortness of breath.   Cardiovascular: Negative for chest pain, palpitations and leg swelling.  Gastrointestinal: Negative for abdominal pain, blood in stool, constipation, diarrhea, nausea and vomiting.  Genitourinary: Negative for dysuria.  Musculoskeletal: Negative for falls and myalgias.  Skin: Negative for rash.  Neurological: Negative for dizziness.  Psychiatric/Behavioral: Negative for depression. The patient is not nervous/anxious.       Past Medical History:  Diagnosis Date  . Hyperlipidemia   . Obesity   . Stress incontinence     reports that she has never smoked. She has never used smokeless tobacco. She reports current alcohol use. She reports that she does not use drugs.   Current Outpatient Medications:  .  aspirin 81 MG tablet, Take 81 mg by mouth daily., Disp: , Rfl:  .  Calcium Carb-Cholecalciferol (CALCIUM 600+D3) 600-800 MG-UNIT TABS, , Disp: , Rfl:  .  Cholecalciferol (D3 VITAMIN PO), Take 50 mcg by mouth daily., Disp: , Rfl:  .  Cyanocobalamin (VITAMIN B 12 PO), Take 5,000 mg by mouth daily. , Disp: , Rfl:  .  EPINEPHrine 0.3 mg/0.3 mL IJ SOAJ injection, , Disp: , Rfl:  .  fluticasone (FLONASE) 50 MCG/ACT nasal spray, Place 2 sprays into both nostrils daily., Disp: , Rfl:  .  Misc Natural Products (TUMERSAID PO), Take 500 mg by mouth daily. , Disp: , Rfl:    Observations/Objective: Blood pressure 130/78, pulse 75, temperature 98.6 F (37 C), temperature source Temporal, height 5' 0.5" (1.537 m), weight 202 lb 8 oz (91.9 kg), last menstrual period 08/01/2013, SpO2 95 %.  Physical Exam Constitutional:      General: She is not in acute distress.    Appearance: Normal appearance. She is well-developed. She is not ill-appearing or toxic-appearing.  HENT:     Head: Normocephalic.     Right Ear: Hearing, tympanic membrane and ear canal normal. Swelling and tenderness present. No middle ear effusion. Tympanic membrane is not erythematous, retracted or bulging.     Left Ear: Hearing, tympanic membrane, ear  canal and external ear normal. Tympanic membrane is not erythematous, retracted or bulging.     Ears:     Comments:  Mild erythema, swelling in right ear canal. Tender to otoscope tip.      Nose: No mucosal edema or rhinorrhea.     Right Sinus: No maxillary sinus tenderness or frontal sinus tenderness.     Left Sinus: No maxillary sinus tenderness or frontal sinus tenderness.     Mouth/Throat:     Pharynx: Uvula midline.  Eyes:     General: Lids are normal. Lids are everted, no foreign bodies appreciated.      Conjunctiva/sclera: Conjunctivae normal.     Pupils: Pupils are equal, round, and reactive to light.  Neck:     Thyroid: No thyroid mass or thyromegaly.     Vascular: No carotid bruit.     Trachea: Trachea normal.  Cardiovascular:     Rate and Rhythm: Normal rate and regular rhythm.     Pulses: Normal pulses.     Heart sounds: Normal heart sounds, S1 normal and S2 normal. No murmur. No friction rub. No gallop.   Pulmonary:     Effort: Pulmonary effort is normal. No tachypnea or respiratory distress.     Breath sounds: Normal breath sounds. No decreased breath sounds, wheezing, rhonchi or rales.  Abdominal:     General: Bowel sounds are normal.     Palpations: Abdomen is soft.     Tenderness: There is no abdominal tenderness.  Musculoskeletal:     Cervical back: Normal range of motion and neck supple.  Lymphadenopathy:     Head:     Right side of head: Tonsillar and preauricular adenopathy present. No submental, submandibular, posterior auricular or occipital adenopathy.     Left side of head: No submental, submandibular, tonsillar, preauricular, posterior auricular or occipital adenopathy.     Cervical: No cervical adenopathy.     Right cervical: No superficial, deep or posterior cervical adenopathy.    Left cervical: No superficial, deep or posterior cervical adenopathy.     Upper Body:     Right upper body: No supraclavicular adenopathy.     Left upper body: No supraclavicular adenopathy.  Skin:    General: Skin is warm and dry.     Findings: No rash.  Neurological:     Mental Status: She is alert.  Psychiatric:        Mood and Affect: Mood is not anxious or depressed.        Speech: Speech normal.        Behavior: Behavior normal. Behavior is cooperative.        Thought Content: Thought content normal.        Judgment: Judgment normal.      Assessment and Plan Otitis externa, chronic infective, right Treat with otic antibiotic steroid combo. Add antihistamine and  continue flonase.  Lymphadenopathy Likely due to right ear chronic infection with recent worsening. IF not resolving in 2 weeks consider re-eval.       Kerby Nora, MD

## 2020-04-06 ENCOUNTER — Other Ambulatory Visit: Payer: Self-pay

## 2020-04-06 ENCOUNTER — Ambulatory Visit (INDEPENDENT_AMBULATORY_CARE_PROVIDER_SITE_OTHER): Payer: 59

## 2020-04-06 DIAGNOSIS — Z23 Encounter for immunization: Secondary | ICD-10-CM

## 2020-04-06 NOTE — Progress Notes (Signed)
Per orders of Mayra Reel, NP, injection of Shingrix given by Sherrie George. Patient tolerated injection well.

## 2020-05-30 DIAGNOSIS — H52203 Unspecified astigmatism, bilateral: Secondary | ICD-10-CM | POA: Diagnosis not present

## 2020-05-30 DIAGNOSIS — H5203 Hypermetropia, bilateral: Secondary | ICD-10-CM | POA: Diagnosis not present

## 2020-05-30 DIAGNOSIS — Q141 Congenital malformation of retina: Secondary | ICD-10-CM | POA: Diagnosis not present

## 2020-05-30 DIAGNOSIS — H43811 Vitreous degeneration, right eye: Secondary | ICD-10-CM | POA: Diagnosis not present

## 2020-05-30 DIAGNOSIS — H2511 Age-related nuclear cataract, right eye: Secondary | ICD-10-CM | POA: Diagnosis not present

## 2020-05-30 DIAGNOSIS — H25812 Combined forms of age-related cataract, left eye: Secondary | ICD-10-CM | POA: Diagnosis not present

## 2020-05-30 DIAGNOSIS — H524 Presbyopia: Secondary | ICD-10-CM | POA: Diagnosis not present

## 2020-05-30 DIAGNOSIS — H04123 Dry eye syndrome of bilateral lacrimal glands: Secondary | ICD-10-CM | POA: Diagnosis not present

## 2020-08-02 ENCOUNTER — Other Ambulatory Visit: Payer: Self-pay

## 2020-08-02 DIAGNOSIS — M8580 Other specified disorders of bone density and structure, unspecified site: Secondary | ICD-10-CM

## 2020-08-02 DIAGNOSIS — Z1239 Encounter for other screening for malignant neoplasm of breast: Secondary | ICD-10-CM

## 2020-09-20 ENCOUNTER — Ambulatory Visit: Payer: 59

## 2020-10-11 ENCOUNTER — Encounter: Payer: Self-pay | Admitting: Primary Care

## 2020-10-11 ENCOUNTER — Other Ambulatory Visit: Payer: Self-pay

## 2020-10-11 ENCOUNTER — Ambulatory Visit (INDEPENDENT_AMBULATORY_CARE_PROVIDER_SITE_OTHER): Payer: 59 | Admitting: Primary Care

## 2020-10-11 VITALS — BP 128/62 | HR 87 | Temp 98.7°F | Ht 60.5 in | Wt 191.0 lb

## 2020-10-11 DIAGNOSIS — N3946 Mixed incontinence: Secondary | ICD-10-CM | POA: Diagnosis not present

## 2020-10-11 DIAGNOSIS — E785 Hyperlipidemia, unspecified: Secondary | ICD-10-CM

## 2020-10-11 DIAGNOSIS — Z0000 Encounter for general adult medical examination without abnormal findings: Secondary | ICD-10-CM | POA: Diagnosis not present

## 2020-10-11 DIAGNOSIS — M8580 Other specified disorders of bone density and structure, unspecified site: Secondary | ICD-10-CM | POA: Diagnosis not present

## 2020-10-11 DIAGNOSIS — R7303 Prediabetes: Secondary | ICD-10-CM

## 2020-10-11 DIAGNOSIS — J309 Allergic rhinitis, unspecified: Secondary | ICD-10-CM

## 2020-10-11 DIAGNOSIS — E559 Vitamin D deficiency, unspecified: Secondary | ICD-10-CM | POA: Diagnosis not present

## 2020-10-11 DIAGNOSIS — R200 Anesthesia of skin: Secondary | ICD-10-CM

## 2020-10-11 DIAGNOSIS — Z23 Encounter for immunization: Secondary | ICD-10-CM | POA: Diagnosis not present

## 2020-10-11 LAB — COMPREHENSIVE METABOLIC PANEL
ALT: 16 U/L (ref 0–35)
AST: 18 U/L (ref 0–37)
Albumin: 3.9 g/dL (ref 3.5–5.2)
Alkaline Phosphatase: 56 U/L (ref 39–117)
BUN: 16 mg/dL (ref 6–23)
CO2: 32 mEq/L (ref 19–32)
Calcium: 8.8 mg/dL (ref 8.4–10.5)
Chloride: 106 mEq/L (ref 96–112)
Creatinine, Ser: 0.74 mg/dL (ref 0.40–1.20)
GFR: 83.11 mL/min (ref >60.00)
Glucose, Bld: 91 mg/dL (ref 70–99)
Potassium: 4 mEq/L (ref 3.5–5.1)
Sodium: 144 mEq/L (ref 135–145)
Total Bilirubin: 0.6 mg/dL (ref 0.2–1.2)
Total Protein: 6.7 g/dL (ref 6.0–8.3)

## 2020-10-11 LAB — LIPID PANEL
Cholesterol: 202 mg/dL — ABNORMAL HIGH (ref 0–200)
HDL: 54.9 mg/dL (ref >39.00)
LDL Cholesterol: 127 mg/dL — ABNORMAL HIGH (ref 0–99)
NonHDL: 147.59
Total CHOL/HDL Ratio: 4
Triglycerides: 103 mg/dL (ref 0.0–149.0)
VLDL: 20.6 mg/dL (ref 0.0–40.0)

## 2020-10-11 LAB — VITAMIN D 25 HYDROXY (VIT D DEFICIENCY, FRACTURES): VITD: 58.54 ng/mL (ref 30.00–100.00)

## 2020-10-11 LAB — HEMOGLOBIN A1C: Hgb A1c MFr Bld: 5.7 % (ref 4.6–6.5)

## 2020-10-11 NOTE — Assessment & Plan Note (Signed)
Compliant to vitamin D3, continue same.

## 2020-10-11 NOTE — Assessment & Plan Note (Signed)
Immunizations UTD. Mammogram and bone density scans due and scheduled. Colon cancer screening UTD, due in 2024.  Commended her on weight loss

## 2020-10-11 NOTE — Assessment & Plan Note (Signed)
Commended her on an improved diet with weight loss. Repeat lipid panel pending.

## 2020-10-11 NOTE — Addendum Note (Signed)
Addended by: Donnamarie Poag on: 10/11/2020 09:49 AM   Modules accepted: Orders

## 2020-10-11 NOTE — Assessment & Plan Note (Signed)
Seasonal, doing well on Flonase and Zyrtec.  Continue same.

## 2020-10-11 NOTE — Patient Instructions (Signed)
Stop by the lab prior to leaving today. I will notify you of your results once received.   Continue exercising. You should be getting 150 minutes of moderate intensity exercise weekly.  Continue to work on a healthy diet. Ensure you are consuming 64 ounces of water daily.  It was a pleasure to see you today!   Preventive Care 21 Years and Older, Female Preventive care refers to lifestyle choices and visits with your health care provider that can promote health and wellness. This includes:  A yearly physical exam. This is also called an annual wellness visit.  Regular dental and eye exams.  Immunizations.  Screening for certain conditions.  Healthy lifestyle choices, such as: ? Eating a healthy diet. ? Getting regular exercise. ? Not using drugs or products that contain nicotine and tobacco. ? Limiting alcohol use. What can I expect for my preventive care visit? Physical exam Your health care provider will check your:  Height and weight. These may be used to calculate your BMI (body mass index). BMI is a measurement that tells if you are at a healthy weight.  Heart rate and blood pressure.  Body temperature.  Skin for abnormal spots. Counseling Your health care provider may ask you questions about your:  Past medical problems.  Family's medical history.  Alcohol, tobacco, and drug use.  Emotional well-being.  Home life and relationship well-being.  Sexual activity.  Diet, exercise, and sleep habits.  History of falls.  Memory and ability to understand (cognition).  Work and work Statistician.  Pregnancy and menstrual history.  Access to firearms. What immunizations do I need? Vaccines are usually given at various ages, according to a schedule. Your health care provider will recommend vaccines for you based on your age, medical history, and lifestyle or other factors, such as travel or where you work.   What tests do I need? Blood tests  Lipid and  cholesterol levels. These may be checked every 5 years, or more often depending on your overall health.  Hepatitis C test.  Hepatitis B test. Screening  Lung cancer screening. You may have this screening every year starting at age 69 if you have a 30-pack-year history of smoking and currently smoke or have quit within the past 15 years.  Colorectal cancer screening. ? All adults should have this screening starting at age 69 and continuing until age 9. ? Your health care provider may recommend screening at age 2 if you are at increased risk. ? You will have tests every 1-10 years, depending on your results and the type of screening test.  Diabetes screening. ? This is done by checking your blood sugar (glucose) after you have not eaten for a while (fasting). ? You may have this done every 1-3 years.  Mammogram. ? This may be done every 1-2 years. ? Talk with your health care provider about how often you should have regular mammograms.  Abdominal aortic aneurysm (AAA) screening. You may need this if you are a current or former smoker.  BRCA-related cancer screening. This may be done if you have a family history of breast, ovarian, tubal, or peritoneal cancers. Other tests  STD (sexually transmitted disease) testing, if you are at risk.  Bone density scan. This is done to screen for osteoporosis. You may have this done starting at age 69. Talk with your health care provider about your test results, treatment options, and if necessary, the need for more tests. Follow these instructions at home: Eating and drinking  Eat a diet that includes fresh fruits and vegetables, whole grains, lean protein, and low-fat dairy products. Limit your intake of foods with high amounts of sugar, saturated fats, and salt.  Take vitamin and mineral supplements as recommended by your health care provider.  Do not drink alcohol if your health care provider tells you not to drink.  If you drink  alcohol: ? Limit how much you have to 0-1 drink a day. ? Be aware of how much alcohol is in your drink. In the [place] one drink equals one 12 oz bottle of beer (355 mL), one 5 oz glass of wine (148 mL), or one 1 oz glass of hard liquor (44 mL).   Lifestyle  Take daily care of your teeth and gums. Brush your teeth every morning and night with fluoride toothpaste. Floss one time each day.  Stay active. Exercise for at least 30 minutes 5 or more days each week.  Do not use any products that contain nicotine or tobacco, such as cigarettes, e-cigarettes, and chewing tobacco. If you need help quitting, ask your health care provider.  Do not use drugs.  If you are sexually active, practice safe sex. Use a condom or other form of protection in order to prevent STIs (sexually transmitted infections).  Talk with your health care provider about taking a low-dose aspirin or statin.  Find healthy ways to cope with stress, such as: ? Meditation, yoga, or listening to music. ? Journaling. ? Talking to a trusted person. ? Spending time with friends and family. Safety  Always wear your seat belt while driving or riding in a vehicle.  Do not drive: ? If you have been drinking alcohol. Do not ride with someone who has been drinking. ? When you are tired or distracted. ? While texting.  Wear a helmet and other protective equipment during sports activities.  If you have firearms in your house, make sure you follow all gun safety procedures. What's next?  Visit your health care provider once a year for an annual wellness visit.  Ask your health care provider how often you should have your eyes and teeth checked.  Stay up to date on all vaccines. This information is not intended to replace advice given to you by your health care provider. Make sure you discuss any questions you have with your health care provider. Document Revised: 07/13/2020 Document Reviewed: 07/17/2018 Elsevier Patient  Education  2021 [employer].

## 2020-10-11 NOTE — Assessment & Plan Note (Signed)
Improved with walking and home PT exercises. Commended her on weight loss.

## 2020-10-11 NOTE — Assessment & Plan Note (Signed)
Commended her on weight loss!  Repeat A1C pending. 

## 2020-10-11 NOTE — Assessment & Plan Note (Signed)
Due for repeat bone density scan for which is scheduled for April 2022.  Compliant to calcium and vitamin D, also walking daily. Continue same.

## 2020-10-11 NOTE — Progress Notes (Signed)
Subjective:    Patient ID: Raven Campos, female    DOB: 11-12-1950, 70 y.o.   MRN: [id number]  HPI  This visit occurred during the SARS-CoV-2 public health emergency.  Safety protocols were in place, including screening questions prior to the visit, additional usage of staff PPE, and extensive cleaning of exam room while observing appropriate contact time as indicated for disinfecting solutions.   Raven Campos is a 70 year old female who presents today for complete physical.  Immunizations: -Tetanus: 2018 -Influenza: Completed this season  -Shingles: Completed Shingrix -Pneumonia: Prevnar 2019, Pneumovax due today -Covid-19: Completed three vaccines  Diet: She endorses an improved diet.  Exercise: She is walking nightly   Eye exam: Completes annually  Dental exam: Completes semi-annually   Mammogram: Scheduled Dexa: Scheduled Colonoscopy: Completed Cologuard in 2021, negative Hep C Screen: Negative  BP Readings from Last 3 Encounters:  10/11/20 128/62  12/15/19 130/78  09/22/19 126/76   Wt Readings from Last 3 Encounters:  10/11/20 191 lb (86.6 kg)  12/15/19 202 lb 8 oz (91.9 kg)  09/22/19 197 lb 8 oz (89.6 kg)      Review of Systems  Constitutional: Negative for unexpected weight change.  HENT: Negative for rhinorrhea.   Respiratory: Negative for cough and shortness of breath.   Cardiovascular: Negative for chest pain.  Gastrointestinal: Negative for constipation and diarrhea.  Genitourinary: Negative for difficulty urinating.  Musculoskeletal: Negative for arthralgias and myalgias.  Skin: Negative for rash.  Allergic/Immunologic: Negative for environmental allergies.  Neurological: Negative for dizziness, numbness and headaches.  Psychiatric/Behavioral: The patient is not nervous/anxious.        Past Medical History:  Diagnosis Date  . Hyperlipidemia   . Lymphadenopathy 12/15/2019  . Obesity   . Stress incontinence      Social History    Socioeconomic History  . Marital status: Married    Spouse name: Not on file  . Number of children: Not on file  . Years of education: Not on file  . Highest education level: Not on file  Occupational History  . Not on file  Tobacco Use  . Smoking status: Never Smoker  . Smokeless tobacco: Never Used  Substance and Sexual Activity  . Alcohol use: Yes    Alcohol/week: 0.0 standard drinks    Comment: occ  . Drug use: No  . Sexual activity: Not on file  Other Topics Concern  . Not on file  Social History Narrative   Married.   Highest level of education: Associates degree in Nursing.   Works at [employer].   Enjoys gardening, yard work, sewing, working puzzles.   Social Determinants of Health   Financial Resource Strain: Not on file  Food Insecurity: Not on file  Transportation Needs: Not on file  Physical Activity: Not on file  Stress: Not on file  Social Connections: Not on file  Intimate Partner Violence: Not on file    History reviewed. No pertinent surgical history.  Family History  Problem Relation Age of Onset  . Arthritis Mother   . Hyperlipidemia Mother   . Hypertension Mother   . Cancer Father        lung  . Cancer Other   . Obesity Other   . Hypertension Other     Allergies  Allergen Reactions  . Bee Venom Anaphylaxis    Current Outpatient Medications on File Prior to Visit  Medication Sig Dispense Refill  . aspirin 81 MG tablet Take 81  mg by mouth daily.    . Calcium Carb-Cholecalciferol 600-800 MG-UNIT TABS     . Cholecalciferol (D3 VITAMIN PO) Take 50 mcg by mouth daily.    . Cyanocobalamin (VITAMIN B 12 PO) Take 5,000 mg by mouth daily.     Marland Kitchen EPINEPHrine 0.3 mg/0.3 mL IJ SOAJ injection     . fluticasone (FLONASE) 50 MCG/ACT nasal spray Place 2 sprays into both nostrils daily.    . Misc Natural Products (TUMERSAID PO) Take 500 mg by mouth daily.      No current facility-administered medications on file prior to  visit.    BP 128/62   Pulse 87   Temp 98.7 F (37.1 C) (Temporal)   Ht 5' 0.5" (1.537 m)   Wt 191 lb (86.6 kg)   LMP 08/01/2013   SpO2 95%   BMI 36.69 kg/m    Objective:   Physical Exam Constitutional:      Appearance: She is well-nourished.  HENT:     Right Ear: Tympanic membrane and ear canal normal.     Left Ear: Tympanic membrane and ear canal normal.     Mouth/Throat:     Mouth: Oropharynx is clear and moist.  Eyes:     Extraocular Movements: EOM normal.     Pupils: Pupils are equal, round, and reactive to light.  Cardiovascular:     Rate and Rhythm: Normal rate and regular rhythm.  Pulmonary:     Effort: Pulmonary effort is normal.     Breath sounds: Normal breath sounds.  Abdominal:     General: Bowel sounds are normal.     Palpations: Abdomen is soft.     Tenderness: There is no abdominal tenderness.  Musculoskeletal:        General: Normal range of motion.     Cervical back: Neck supple.  Skin:    General: Skin is warm and dry.  Neurological:     Mental Status: She is alert and oriented to person, place, and time.     Cranial Nerves: No cranial nerve deficit.     Deep Tendon Reflexes:     Reflex Scores:      Patellar reflexes are 2+ on the right side and 2+ on the left side. Psychiatric:        Mood and Affect: Mood and affect and mood normal.            Assessment & Plan:

## 2020-10-11 NOTE — Assessment & Plan Note (Signed)
Intermittent, overall stable. Encouraged [place] exercises.

## 2020-10-25 ENCOUNTER — Other Ambulatory Visit: Payer: Self-pay

## 2020-10-25 ENCOUNTER — Ambulatory Visit
Admission: RE | Admit: 2020-10-25 | Discharge: 2020-10-25 | Disposition: A | Discharge status: Home / Self Care | Payer: 59 | Source: Ambulatory Visit | Attending: Primary Care | Admitting: Primary Care

## 2020-10-25 DIAGNOSIS — Z1231 Encounter for screening mammogram for malignant neoplasm of breast: Secondary | ICD-10-CM | POA: Diagnosis not present

## 2020-10-25 DIAGNOSIS — Z1239 Encounter for other screening for malignant neoplasm of breast: Secondary | ICD-10-CM

## 2020-11-22 ENCOUNTER — Other Ambulatory Visit: Payer: Self-pay

## 2020-11-22 ENCOUNTER — Ambulatory Visit
Admission: RE | Admit: 2020-11-22 | Discharge: 2020-11-22 | Disposition: A | Discharge status: Home / Self Care | Payer: 59 | Source: Ambulatory Visit | Attending: Primary Care | Admitting: Primary Care

## 2020-11-22 DIAGNOSIS — Z780 Asymptomatic menopausal state: Secondary | ICD-10-CM | POA: Diagnosis not present

## 2020-11-22 DIAGNOSIS — M8580 Other specified disorders of bone density and structure, unspecified site: Secondary | ICD-10-CM

## 2020-11-22 DIAGNOSIS — M85851 Other specified disorders of bone density and structure, right thigh: Secondary | ICD-10-CM | POA: Diagnosis not present

## 2021-05-30 DIAGNOSIS — Q141 Congenital malformation of retina: Secondary | ICD-10-CM | POA: Diagnosis not present

## 2021-05-30 DIAGNOSIS — H2511 Age-related nuclear cataract, right eye: Secondary | ICD-10-CM | POA: Diagnosis not present

## 2021-05-30 DIAGNOSIS — H5203 Hypermetropia, bilateral: Secondary | ICD-10-CM | POA: Diagnosis not present

## 2021-05-30 DIAGNOSIS — H25812 Combined forms of age-related cataract, left eye: Secondary | ICD-10-CM | POA: Diagnosis not present

## 2021-05-30 DIAGNOSIS — H04123 Dry eye syndrome of bilateral lacrimal glands: Secondary | ICD-10-CM | POA: Diagnosis not present

## 2021-05-30 DIAGNOSIS — H43811 Vitreous degeneration, right eye: Secondary | ICD-10-CM | POA: Diagnosis not present

## 2021-06-01 ENCOUNTER — Ambulatory Visit
Admission: EM | Admit: 2021-06-01 | Discharge: 2021-06-01 | Disposition: A | Discharge status: Home / Self Care | Payer: 59 | Attending: Internal Medicine | Admitting: Internal Medicine

## 2021-06-01 ENCOUNTER — Encounter: Payer: Self-pay | Admitting: Emergency Medicine

## 2021-06-01 ENCOUNTER — Other Ambulatory Visit: Payer: Self-pay

## 2021-06-01 DIAGNOSIS — M5442 Lumbago with sciatica, left side: Secondary | ICD-10-CM

## 2021-06-01 MED ORDER — PREDNISONE 20 MG PO TABS: 40.0000 mg | ORAL_TABLET | Freq: Every day | ORAL | 0 refills | Status: AC

## 2021-06-01 NOTE — ED Triage Notes (Signed)
Woke up from sleep with pain extending from lower back radiating down into left leg. Denies hx of back problems, urinary problems. Pain worsens with movement or pressure applied to leg, relieved with rest

## 2021-06-01 NOTE — Discharge Instructions (Signed)
It appears that you have low back pain with sciatica.  You have been prescribed prednisone steroid to help alleviate this.  You may alternate ice and heat application as well.  Follow-up if symptoms persist.

## 2021-06-01 NOTE — ED Provider Notes (Signed)
[facility]    CSN: [id number] Arrival date & time: 06/01/21  1811      History   Chief Complaint Chief Complaint  Patient presents with   Back Pain    HPI Raven Campos is a 70 y.o. female.   Patient presents with left lower back pain that extends into the left leg that started today.  She denies any apparent injury and states that she woke up with the back pain.  Denies any urinary burning, urinary frequency, saddle anesthesia, urinary or bowel incontinence.  Denies any history of chronic back pain.  Denies any fevers.  Pain is consistent.  Has taking Advil consistently throughout the day with no improvement in pain.   Back Pain  Past Medical History:  Diagnosis Date   Hyperlipidemia    Lymphadenopathy 12/15/2019   Obesity    Stress incontinence     Patient Active Problem List   Diagnosis Date Noted   Allergic rhinitis 10/11/2020   Osteopenia 09/16/2018   Prediabetes 09/16/2018   Elevated blood pressure reading 09/07/2016   Preventative health care 03/01/2015   Vitamin D deficiency 03/01/2015   Hyperlipidemia 01/10/2015   Urine incontinence 01/10/2015   Numbness 01/10/2015    History reviewed. No pertinent surgical history.  OB History   No obstetric history on file.      Home Medications    Prior to Admission medications   Medication Sig Start Date End Date Taking? Authorizing Provider  predniSONE (DELTASONE) 20 MG tablet Take 2 tablets (40 mg total) by mouth daily for 5 days. 06/01/21 06/06/21 Yes Gustavus Bryant, FNP  aspirin 81 MG tablet Take 81 mg by mouth daily.    [provider]  Calcium Carb-Cholecalciferol 600-800 MG-UNIT TABS     [provider]  Cholecalciferol (D3 VITAMIN PO) Take 50 mcg by mouth daily.    [provider]  Cyanocobalamin (VITAMIN B 12 PO) Take 5,000 mg by mouth daily.     [provider]  EPINEPHrine 0.3 mg/0.3 mL IJ SOAJ injection  09/22/19   [provider]   fluticasone (FLONASE) 50 MCG/ACT nasal spray Place 2 sprays into both nostrils daily.    [provider]  Misc Natural Products (TUMERSAID PO) Take 500 mg by mouth daily.     [provider]    Family History Family History  Problem Relation Age of Onset   Arthritis Mother    Hyperlipidemia Mother    Hypertension Mother    Cancer Father        lung   Cancer Other    Obesity Other    Hypertension Other     Social History Social History   Tobacco Use   Smoking status: Never   Smokeless tobacco: Never  Substance Use Topics   Alcohol use: Yes    Alcohol/week: 0.0 standard drinks    Comment: occ   Drug use: No     Allergies   Bee venom   Review of Systems Review of Systems Per HPI  Physical Exam Triage Vital Signs ED Triage Vitals [06/01/21 1835]  Enc Vitals Group     BP (!) 157/90     Pulse Rate 80     Resp 16     Temp 97.8 F (36.6 C)     Temp Source Oral     SpO2 96 %     Weight      Height      Head Circumference  Peak Flow      Pain Score 8     Pain Loc      Pain Edu?      Excl. in GC?    No data found.  Updated Vital Signs BP (!) 157/90 (BP Location: Left Arm)   Pulse 80   Temp 97.8 F (36.6 C) (Oral)   Resp 16   LMP 08/01/2013   SpO2 96%   Visual Acuity Right Eye Distance:   Left Eye Distance:   Bilateral Distance:    Right Eye Near:   Left Eye Near:    Bilateral Near:     Physical Exam Constitutional:      General: She is not in acute distress.    Appearance: Normal appearance. She is not toxic-appearing or diaphoretic.  HENT:     Head: Normocephalic and atraumatic.  Eyes:     Extraocular Movements: Extraocular movements intact.     Conjunctiva/sclera: Conjunctivae normal.  Pulmonary:     Effort: Pulmonary effort is normal.  Musculoskeletal:     Cervical back: Normal.     Thoracic back: Normal.     Lumbar back: Tenderness present. No swelling, edema or bony tenderness. Normal range of motion.  Positive left straight leg raise test. Negative right straight leg raise test.       Back:     Comments: Tenderness to palpation to area on diagram.  No step-off or direct spinal tenderness.  Neurological:     General: No focal deficit present.     Mental Status: She is alert and oriented to person, place, and time. Mental status is at baseline.  Psychiatric:        Mood and Affect: Mood normal.        Behavior: Behavior normal.        Thought Content: Thought content normal.        Judgment: Judgment normal.     UC Treatments / Results  Labs (all labs ordered are listed, but only abnormal results are displayed) Labs Reviewed - No data to display  EKG   Radiology No results found.  Procedures Procedures (including critical care time)  Medications Ordered in UC Medications - No data to display  Initial Impression / Assessment and Plan / UC Course  I have reviewed the triage vital signs and the nursing notes.  Pertinent labs & imaging results that were available during my care of the patient were reviewed by me and considered in my medical decision making (see chart for details).     Patient's exam is consistent with low back pain with sciatica.  Pain is not responsive to NSAIDs.  Will treat with prednisone x5 days.  Discussed risks associated with taking prednisone and osteopenia.  Patient reports that she has taken prednisone in the past and tolerated well.  Patient to alternate ice and heat as well.  Discussed return precautions.  Patient was agreeable plan and voiced understanding. Final Clinical Impressions(s) / UC Diagnoses   Final diagnoses:  Acute left-sided low back pain with left-sided sciatica     Discharge Instructions      It appears that you have low back pain with sciatica.  You have been prescribed prednisone steroid to help alleviate this.  You may alternate ice and heat application as well.  Follow-up if symptoms persist.     ED Prescriptions      Medication Sig Dispense Auth. Provider   predniSONE (DELTASONE) 20 MG tablet Take 2 tablets (40 mg total) by mouth  daily for 5 days. 10 tablet Gustavus Bryant, [place]      PDMP not reviewed this encounter.   Gustavus Bryant, [place] 06/01/21 [phone]

## 2021-06-18 ENCOUNTER — Ambulatory Visit
Admission: EM | Admit: 2021-06-18 | Discharge: 2021-06-18 | Disposition: A | Discharge status: Home / Self Care | Payer: 59 | Attending: Physician Assistant | Admitting: Physician Assistant

## 2021-06-18 ENCOUNTER — Other Ambulatory Visit: Payer: Self-pay

## 2021-06-18 DIAGNOSIS — J101 Influenza due to other identified influenza virus with other respiratory manifestations: Secondary | ICD-10-CM

## 2021-06-18 LAB — POCT INFLUENZA A/B
Influenza A, POC: POSITIVE — AB
Influenza B, POC: NEGATIVE

## 2021-06-18 MED ORDER — OSELTAMIVIR PHOSPHATE 75 MG PO CAPS: 75.0000 mg | ORAL_CAPSULE | Freq: Two times a day (BID) | ORAL | 0 refills | Status: DC

## 2021-06-18 NOTE — ED Triage Notes (Signed)
Three day h/o cough, congestion, HA and right ear drainage. Notes some episodes of post-tussive emesis. Has been taking tylenol, mucinex and other otc cough meds. Son is sick as well.

## 2021-06-18 NOTE — ED Provider Notes (Signed)
[facility]    CSN: [id number] Arrival date & time: 06/18/21  1222      History   Chief Complaint Chief Complaint  Patient presents with   Cough    HPI Raven Campos is a 70 y.o. female.   Patient here today for evaluation of cough, congestion, and right ear discomfort that started 3 days ago.  She reports she has had some vomiting after cough but no real nausea.  She denies any diarrhea.  She does not report fever.  She has tried taking over-the-counter medications without significant relief.  Son is also sick with similar symptoms.  The history is provided by the patient.   Past Medical History:  Diagnosis Date   Hyperlipidemia    Lymphadenopathy 12/15/2019   Obesity    Stress incontinence     Patient Active Problem List   Diagnosis Date Noted   Allergic rhinitis 10/11/2020   Osteopenia 09/16/2018   Prediabetes 09/16/2018   Elevated blood pressure reading 09/07/2016   Preventative health care 03/01/2015   Vitamin D deficiency 03/01/2015   Hyperlipidemia 01/10/2015   Urine incontinence 01/10/2015   Numbness 01/10/2015    History reviewed. No pertinent surgical history.  OB History   No obstetric history on file.      Home Medications    Prior to Admission medications   Medication Sig Start Date End Date Taking? Authorizing Provider  oseltamivir (TAMIFLU) 75 MG capsule Take 1 capsule (75 mg total) by mouth every 12 (twelve) hours. 06/18/21  Yes Tomi Bamberger, PA-C  aspirin 81 MG tablet Take 81 mg by mouth daily.    [provider]  Calcium Carb-Cholecalciferol 600-800 MG-UNIT TABS     [provider]  Cholecalciferol (D3 VITAMIN PO) Take 50 mcg by mouth daily.    [provider]  Cyanocobalamin (VITAMIN B 12 PO) Take 5,000 mg by mouth daily.     [provider]  EPINEPHrine 0.3 mg/0.3 mL IJ SOAJ injection  09/22/19   [provider]  fluticasone (FLONASE) 50 MCG/ACT nasal spray Place 2 sprays  into both nostrils daily.    [provider]  Misc Natural Products (TUMERSAID PO) Take 500 mg by mouth daily.     [provider]    Family History Family History  Problem Relation Age of Onset   Arthritis Mother    Hyperlipidemia Mother    Hypertension Mother    Cancer Father        lung   Cancer Other    Obesity Other    Hypertension Other     Social History Social History   Tobacco Use   Smoking status: Never   Smokeless tobacco: Never  Substance Use Topics   Alcohol use: Yes    Alcohol/week: 0.0 standard drinks    Comment: occ   Drug use: No     Allergies   Bee venom   Review of Systems Review of Systems  Constitutional:  Negative for chills and fever.  HENT:  Positive for congestion, ear pain and sinus pressure. Negative for sore throat.   Eyes:  Negative for discharge and redness.  Respiratory:  Positive for cough. Negative for shortness of breath and wheezing.   Gastrointestinal:  Positive for vomiting. Negative for abdominal pain, diarrhea and nausea.    Physical Exam Triage Vital Signs ED Triage Vitals  Enc Vitals Group     BP      Pulse      Resp  Temp      Temp src      SpO2      Weight      Height      Head Circumference      Peak Flow      Pain Score      Pain Loc      Pain Edu?      Excl. in GC?    No data found.  Updated Vital Signs BP 120/73 (BP Location: Left Arm)   Pulse 82   Temp 98.1 F (36.7 C) (Oral)   Resp 18   LMP 08/01/2013   SpO2 96%    Physical Exam Vitals and nursing note reviewed.  Constitutional:      General: She is not in acute distress.    Appearance: Normal appearance. She is not ill-appearing.  HENT:     Head: Normocephalic and atraumatic.     Right Ear: Tympanic membrane normal.     Left Ear: Tympanic membrane normal.     Nose: Congestion present.     Mouth/Throat:     Mouth: Mucous membranes are moist.     Pharynx: No oropharyngeal exudate or posterior oropharyngeal  erythema.  Eyes:     Conjunctiva/sclera: Conjunctivae normal.  Cardiovascular:     Rate and Rhythm: Normal rate and regular rhythm.     Heart sounds: Normal heart sounds. No murmur heard. Pulmonary:     Effort: Pulmonary effort is normal. No respiratory distress.     Breath sounds: Normal breath sounds. No wheezing, rhonchi or rales.  Skin:    General: Skin is warm and dry.  Neurological:     Mental Status: She is alert.  Psychiatric:        Mood and Affect: Mood normal.        Thought Content: Thought content normal.     UC Treatments / Results  Labs (all labs ordered are listed, but only abnormal results are displayed) Labs Reviewed  POCT INFLUENZA A/B - Abnormal; Notable for the following components:      Result Value   Influenza A, POC Positive (*)    All other components within normal limits    EKG   Radiology No results found.  Procedures Procedures (including critical care time)  Medications Ordered in UC Medications - No data to display  Initial Impression / Assessment and Plan / UC Course  I have reviewed the triage vital signs and the nursing notes.  Pertinent labs & imaging results that were available during my care of the patient were reviewed by me and considered in my medical decision making (see chart for details).  Flu test positive in office.  Tamiflu prescribed.  Recommended symptomatic treatment otherwise.  Encouraged follow-up if symptoms fail to improve or worsen.  Final Clinical Impressions(s) / UC Diagnoses   Final diagnoses:  Influenza A   Discharge Instructions   None    ED Prescriptions     Medication Sig Dispense Auth. Provider   oseltamivir (TAMIFLU) 75 MG capsule Take 1 capsule (75 mg total) by mouth every 12 (twelve) hours. 10 capsule Tomi Bamberger, PA-C      PDMP not reviewed this encounter.   Tomi Bamberger, PA-C 06/18/21 1400

## 2021-10-10 DIAGNOSIS — H25812 Combined forms of age-related cataract, left eye: Secondary | ICD-10-CM | POA: Diagnosis not present

## 2021-10-10 DIAGNOSIS — H04123 Dry eye syndrome of bilateral lacrimal glands: Secondary | ICD-10-CM | POA: Diagnosis not present

## 2021-10-10 DIAGNOSIS — Q141 Congenital malformation of retina: Secondary | ICD-10-CM | POA: Diagnosis not present

## 2021-10-10 DIAGNOSIS — H43811 Vitreous degeneration, right eye: Secondary | ICD-10-CM | POA: Diagnosis not present

## 2021-10-10 DIAGNOSIS — H2511 Age-related nuclear cataract, right eye: Secondary | ICD-10-CM | POA: Diagnosis not present

## 2021-10-27 DIAGNOSIS — H25812 Combined forms of age-related cataract, left eye: Secondary | ICD-10-CM | POA: Diagnosis not present

## 2021-11-09 ENCOUNTER — Other Ambulatory Visit: Payer: Self-pay | Admitting: Primary Care

## 2021-11-09 DIAGNOSIS — Z1231 Encounter for screening mammogram for malignant neoplasm of breast: Secondary | ICD-10-CM

## 2021-11-14 ENCOUNTER — Ambulatory Visit
Admission: RE | Admit: 2021-11-14 | Discharge: 2021-11-14 | Disposition: A | Discharge status: Home / Self Care | Payer: 59 | Source: Ambulatory Visit | Attending: Primary Care | Admitting: Primary Care

## 2021-11-14 DIAGNOSIS — Z1231 Encounter for screening mammogram for malignant neoplasm of breast: Secondary | ICD-10-CM

## 2021-11-30 DIAGNOSIS — H2511 Age-related nuclear cataract, right eye: Secondary | ICD-10-CM | POA: Diagnosis not present

## 2021-12-05 DIAGNOSIS — H25811 Combined forms of age-related cataract, right eye: Secondary | ICD-10-CM | POA: Diagnosis not present

## 2022-03-20 ENCOUNTER — Other Ambulatory Visit (HOSPITAL_COMMUNITY): Payer: Self-pay

## 2022-03-20 ENCOUNTER — Ambulatory Visit (INDEPENDENT_AMBULATORY_CARE_PROVIDER_SITE_OTHER): Payer: 59 | Admitting: Primary Care

## 2022-03-20 ENCOUNTER — Encounter: Payer: Self-pay | Admitting: Primary Care

## 2022-03-20 VITALS — BP 134/76 | HR 70 | Temp 98.6°F | Ht 60.5 in | Wt 187.0 lb

## 2022-03-20 DIAGNOSIS — M8580 Other specified disorders of bone density and structure, unspecified site: Secondary | ICD-10-CM | POA: Diagnosis not present

## 2022-03-20 DIAGNOSIS — R7303 Prediabetes: Secondary | ICD-10-CM

## 2022-03-20 DIAGNOSIS — E559 Vitamin D deficiency, unspecified: Secondary | ICD-10-CM

## 2022-03-20 DIAGNOSIS — Z91030 Bee allergy status: Secondary | ICD-10-CM | POA: Diagnosis not present

## 2022-03-20 DIAGNOSIS — E785 Hyperlipidemia, unspecified: Secondary | ICD-10-CM | POA: Diagnosis not present

## 2022-03-20 DIAGNOSIS — Z0000 Encounter for general adult medical examination without abnormal findings: Secondary | ICD-10-CM | POA: Diagnosis not present

## 2022-03-20 DIAGNOSIS — J309 Allergic rhinitis, unspecified: Secondary | ICD-10-CM | POA: Diagnosis not present

## 2022-03-20 LAB — CBC
HCT: 43.3 % (ref 36.0–46.0)
Hemoglobin: 14.3 g/dL (ref 12.0–15.0)
MCHC: 33.1 g/dL (ref 30.0–36.0)
MCV: 90.3 fl (ref 78.0–100.0)
Platelets: 214 10*3/uL (ref 150.0–400.0)
RBC: 4.79 Mil/uL (ref 3.87–5.11)
RDW: 13.3 % (ref 11.5–15.5)
WBC: 5.2 10*3/uL (ref 4.0–10.5)

## 2022-03-20 LAB — COMPREHENSIVE METABOLIC PANEL
ALT: 23 U/L (ref 0–35)
AST: 20 U/L (ref 0–37)
Albumin: 4 g/dL (ref 3.5–5.2)
Alkaline Phosphatase: 63 U/L (ref 39–117)
BUN: 16 mg/dL (ref 6–23)
CO2: 29 mEq/L (ref 19–32)
Calcium: 9.1 mg/dL (ref 8.4–10.5)
Chloride: 108 mEq/L (ref 96–112)
Creatinine, Ser: 0.64 mg/dL (ref 0.40–1.20)
GFR: 89.87 mL/min (ref >60.00)
Glucose, Bld: 95 mg/dL (ref 70–99)
Potassium: 4.4 mEq/L (ref 3.5–5.1)
Sodium: 144 mEq/L (ref 135–145)
Total Bilirubin: 0.6 mg/dL (ref 0.2–1.2)
Total Protein: 6.5 g/dL (ref 6.0–8.3)

## 2022-03-20 LAB — LIPID PANEL
Cholesterol: 209 mg/dL — ABNORMAL HIGH (ref 0–200)
HDL: 52 mg/dL (ref >39.00)
LDL Cholesterol: 134 mg/dL — ABNORMAL HIGH (ref 0–99)
NonHDL: 157.3
Total CHOL/HDL Ratio: 4
Triglycerides: 115 mg/dL (ref 0.0–149.0)
VLDL: 23 mg/dL (ref 0.0–40.0)

## 2022-03-20 LAB — VITAMIN D 25 HYDROXY (VIT D DEFICIENCY, FRACTURES): VITD: 27.03 ng/mL — ABNORMAL LOW (ref 30.00–100.00)

## 2022-03-20 LAB — HEMOGLOBIN A1C: Hgb A1c MFr Bld: 6 % (ref 4.6–6.5)

## 2022-03-20 MED ORDER — EPINEPHRINE 0.3 MG/0.3ML IJ SOAJ
0.3000 mg | INTRAMUSCULAR | 0 refills | Status: DC | PRN
  Filled 2022-03-20: qty 2, 25d supply, fill #0

## 2022-03-20 NOTE — Assessment & Plan Note (Signed)
Immunizations UTD. Mammogram UTD. Bone density scan UTD. Colon cancer screening UTD, due for repeat Cologuard in 2024  Discussed the importance of a healthy diet and regular exercise in order for weight loss, and to reduce the risk of further co-morbidity.  Exam stable.  Labs pending.  Follow up in 1 year for repeat physical.

## 2022-03-20 NOTE — Assessment & Plan Note (Signed)
Discussed the importance of a healthy diet and regular exercise in order for weight loss, and to reduce the risk of further co-morbidity. ? ?Repeat A1C pending. ?

## 2022-03-20 NOTE — Progress Notes (Signed)
Subjective:    Patient ID: Raven Campos, female    DOB: 1951/02/14, 71 y.o.   MRN: [id number]  HPI  Raven Campos is a very pleasant 71 y.o. female who presents today for complete physical and follow up of chronic conditions.  Immunizations: -Tetanus: 2018 -Influenza: Due this season  -Covid-19: 3 vaccines  -Shingles: Completed Shingrix and Zostavax -Pneumonia: Prevnar 13 in 2019, Pneumovax in 2022  Diet: Fair diet.  Exercise: No regular exercise.  Eye exam: Completes annually  Dental exam: Completes semi-annually   Mammogram: Completed in April 2023  Colonoscopy: Completed Cologuard in 2021, negative. Due 2024. Dexa: Completed in 2022  BP Readings from Last 3 Encounters:  03/20/22 134/76  06/18/21 120/73  06/01/21 (!) 157/90        Review of Systems  Constitutional:  Negative for unexpected weight change.  HENT:  Negative for rhinorrhea.   Respiratory:  Negative for cough and shortness of breath.   Cardiovascular:  Negative for chest pain.  Gastrointestinal:  Negative for constipation and diarrhea.  Genitourinary:  Negative for difficulty urinating.  Musculoskeletal:  Negative for arthralgias.  Skin:  Negative for rash.  Allergic/Immunologic: Negative for environmental allergies.  Neurological:  Negative for dizziness and headaches.  Psychiatric/Behavioral:  The patient is not nervous/anxious.          Past Medical History:  Diagnosis Date   Hyperlipidemia    Lymphadenopathy 12/15/2019   Obesity    Stress incontinence     Social History   Socioeconomic History   Marital status: Married    Spouse name: Not on file   Number of children: Not on file   Years of education: Not on file   Highest education level: Not on file  Occupational History   Not on file  Tobacco Use   Smoking status: Never   Smokeless tobacco: Never  Substance and Sexual Activity   Alcohol use: Yes    Alcohol/week: 0.0 standard drinks of alcohol    Comment: occ   Drug  use: No   Sexual activity: Not on file  Other Topics Concern   Not on file  Social History Narrative   Married.   Highest level of education: Associates degree in Nursing.   Works at [employer].   Enjoys gardening, yard work, sewing, working puzzles.   Social Determinants of Health   Financial Resource Strain: Not on file  Food Insecurity: Not on file  Transportation Needs: Not on file  Physical Activity: Not on file  Stress: Not on file  Social Connections: Not on file  Intimate Partner Violence: Not on file    Past Surgical History:  Procedure Laterality Date   CATARACT EXTRACTION     11/30/2021 12/06/2022    Family History  Problem Relation Age of Onset   Arthritis Mother    Hyperlipidemia Mother    Hypertension Mother    Cancer Father        lung   Cancer Other    Obesity Other    Hypertension Other     Allergies  Allergen Reactions   Bee Venom Anaphylaxis    Current Outpatient Medications on File Prior to Visit  Medication Sig Dispense Refill   aspirin 81 MG tablet Take 81 mg by mouth daily.     Calcium Carb-Cholecalciferol 600-800 MG-UNIT TABS      Cholecalciferol (D3 VITAMIN PO) Take 50 mcg by mouth daily.     Cyanocobalamin (VITAMIN B 12 PO) Take 5,000 mg  by mouth daily.      fluticasone (FLONASE) 50 MCG/ACT nasal spray Place 2 sprays into both nostrils daily.     Misc Natural Products (TUMERSAID PO) Take 500 mg by mouth daily.      Red Yeast Rice 600 MG CAPS Take by mouth.     No current facility-administered medications on file prior to visit.    BP 134/76   Pulse 70   Temp 98.6 F (37 C) (Oral)   Ht 5' 0.5" (1.537 m)   Wt 187 lb (84.8 kg)   LMP 08/01/2013   SpO2 96%   BMI 35.92 kg/m  Objective:   Physical Exam HENT:     Right Ear: Tympanic membrane and ear canal normal.     Left Ear: Tympanic membrane and ear canal normal.     Nose: Nose normal.  Eyes:     Conjunctiva/sclera: Conjunctivae normal.     Pupils:  Pupils are equal, round, and reactive to light.  Neck:     Thyroid: No thyromegaly.  Cardiovascular:     Rate and Rhythm: Normal rate and regular rhythm.     Heart sounds: No murmur heard. Pulmonary:     Effort: Pulmonary effort is normal.     Breath sounds: Normal breath sounds. No rales.  Abdominal:     General: Bowel sounds are normal.     Palpations: Abdomen is soft.     Tenderness: There is no abdominal tenderness.  Musculoskeletal:        General: Normal range of motion.     Cervical back: Neck supple.  Lymphadenopathy:     Cervical: No cervical adenopathy.  Skin:    General: Skin is warm and dry.     Findings: No rash.  Neurological:     Mental Status: She is alert and oriented to person, place, and time.     Cranial Nerves: No cranial nerve deficit.     Deep Tendon Reflexes: Reflexes are normal and symmetric.  Psychiatric:        Mood and Affect: Mood normal.           Assessment & Plan:   Problem List Items Addressed This Visit       Respiratory   Allergic rhinitis    Intermittent.  Continue Flonase PRN.        Musculoskeletal and Integument   Osteopenia    Reviewed bone density scan from 2022, continue calcium and vitamin D. Continue daily walking.         Other   Hyperlipidemia    Discussed the importance of a healthy diet and regular exercise in order for weight loss, and to reduce the risk of further co-morbidity.  Repeat lipid panel pending.       Relevant Medications   EPINEPHrine 0.3 mg/0.3 mL IJ SOAJ injection   Other Relevant Orders   Lipid panel   Comprehensive metabolic panel   CBC   Preventative health care - Primary    Immunizations UTD. Mammogram UTD. Bone density scan UTD. Colon cancer screening UTD, due for repeat Cologuard in 2024  Discussed the importance of a healthy diet and regular exercise in order for weight loss, and to reduce the risk of further co-morbidity.  Exam stable.  Labs pending.  Follow up in 1  year for repeat physical.       Vitamin D deficiency    Continue vitamin D supplement. Reviewed bone density scan from 2022      Relevant Orders  VITAMIN D 25 Hydroxy (Vit-D Deficiency, Fractures)   Prediabetes    Discussed the importance of a healthy diet and regular exercise in order for weight loss, and to reduce the risk of further co-morbidity.  Repeat A1C pending.       Relevant Orders   Hemoglobin A1c   Allergy to bee sting    No recent anaphylaxis or use of Epi Pen. Epi Pen out of date, refill provided.       Relevant Medications   EPINEPHrine 0.3 mg/0.3 mL IJ SOAJ injection       Doreene Nest, NP

## 2022-03-20 NOTE — Patient Instructions (Signed)
Stop by the lab prior to leaving today. I will notify you of your results once received.   It was a pleasure to see you today!  Preventive Care 22 Years and Older, Female Preventive care refers to lifestyle choices and visits with your health care provider that can promote health and wellness. Preventive care visits are also called wellness exams. What can I expect for my preventive care visit? Counseling Your health care provider may ask you questions about your: Medical history, including: Past medical problems. Family medical history. Pregnancy and menstrual history. History of falls. Current health, including: Memory and ability to understand (cognition). Emotional well-being. Home life and relationship well-being. Sexual activity and sexual health. Lifestyle, including: Alcohol, nicotine or tobacco, and drug use. Access to firearms. Diet, exercise, and sleep habits. Work and work Statistician. Sunscreen use. Safety issues such as seatbelt and bike helmet use. Physical exam Your health care provider will check your: Height and weight. These may be used to calculate your BMI (body mass index). BMI is a measurement that tells if you are at a healthy weight. Waist circumference. This measures the distance around your waistline. This measurement also tells if you are at a healthy weight and may help predict your risk of certain diseases, such as type 2 diabetes and high blood pressure. Heart rate and blood pressure. Body temperature. Skin for abnormal spots. What immunizations do I need?  Vaccines are usually given at various ages, according to a schedule. Your health care provider will recommend vaccines for you based on your age, medical history, and lifestyle or other factors, such as travel or where you work. What tests do I need? Screening Your health care provider may recommend screening tests for certain conditions. This may include: Lipid and cholesterol  levels. Hepatitis C test. Hepatitis B test. HIV (human immunodeficiency virus) test. STI (sexually transmitted infection) testing, if you are at risk. Lung cancer screening. Colorectal cancer screening. Diabetes screening. This is done by checking your blood sugar (glucose) after you have not eaten for a while (fasting). Mammogram. Talk with your health care provider about how often you should have regular mammograms. BRCA-related cancer screening. This may be done if you have a family history of breast, ovarian, tubal, or peritoneal cancers. Bone density scan. This is done to screen for osteoporosis. Talk with your health care provider about your test results, treatment options, and if necessary, the need for more tests. Follow these instructions at home: Eating and drinking  Eat a diet that includes fresh fruits and vegetables, whole grains, lean protein, and low-fat dairy products. Limit your intake of foods with high amounts of sugar, saturated fats, and salt. Take vitamin and mineral supplements as recommended by your health care provider. Do not drink alcohol if your health care provider tells you not to drink. If you drink alcohol: Limit how much you have to 0-1 drink a day. Know how much alcohol is in your drink. In the [place] one drink equals one 12 oz bottle of beer (355 mL), one 5 oz glass of wine (148 mL), or one 1 oz glass of hard liquor (44 mL). Lifestyle Brush your teeth every morning and night with fluoride toothpaste. Floss one time each day. Exercise for at least 30 minutes 5 or more days each week. Do not use any products that contain nicotine or tobacco. These products include cigarettes, chewing tobacco, and vaping devices, such as e-cigarettes. If you need help quitting, ask your health care provider. Do not use  drugs. If you are sexually active, practice safe sex. Use a condom or other form of protection in order to prevent STIs. Take aspirin only as told by your  health care provider. Make sure that you understand how much to take and what form to take. Work with your health care provider to find out whether it is safe and beneficial for you to take aspirin daily. Ask your health care provider if you need to take a cholesterol-lowering medicine (statin). Find healthy ways to manage stress, such as: Meditation, yoga, or listening to music. Journaling. Talking to a trusted person. Spending time with friends and family. Minimize exposure to UV radiation to reduce your risk of skin cancer. Safety Always wear your seat belt while driving or riding in a vehicle. Do not drive: If you have been drinking alcohol. Do not ride with someone who has been drinking. When you are tired or distracted. While texting. If you have been using any mind-altering substances or drugs. Wear a helmet and other protective equipment during sports activities. If you have firearms in your house, make sure you follow all gun safety procedures. What's next? Visit your health care provider once a year for an annual wellness visit. Ask your health care provider how often you should have your eyes and teeth checked. Stay up to date on all vaccines. This information is not intended to replace advice given to you by your health care provider. Make sure you discuss any questions you have with your health care provider. Document Revised: 01/18/2021 Document Reviewed: 01/18/2021 Elsevier Patient Education  2023 Elsevier Inc.  

## 2022-03-20 NOTE — Assessment & Plan Note (Signed)
Reviewed bone density scan from 2022, continue calcium and vitamin D. Continue daily walking.

## 2022-03-20 NOTE — Assessment & Plan Note (Signed)
Continue vitamin D supplement. Reviewed bone density scan from 2022

## 2022-03-20 NOTE — Assessment & Plan Note (Signed)
Intermittent.  Continue Flonase PRN.

## 2022-03-20 NOTE — Assessment & Plan Note (Signed)
No recent anaphylaxis or use of Epi Pen. Epi Pen out of date, refill provided.

## 2022-03-20 NOTE — Assessment & Plan Note (Signed)
Discussed the importance of a healthy diet and regular exercise in order for weight loss, and to reduce the risk of further co-morbidity.  Repeat lipid panel pending. 

## 2022-07-12 DIAGNOSIS — H35371 Puckering of macula, right eye: Secondary | ICD-10-CM | POA: Diagnosis not present

## 2022-07-12 DIAGNOSIS — Z961 Presence of intraocular lens: Secondary | ICD-10-CM | POA: Diagnosis not present

## 2022-07-12 DIAGNOSIS — H43811 Vitreous degeneration, right eye: Secondary | ICD-10-CM | POA: Diagnosis not present

## 2022-07-12 DIAGNOSIS — Q141 Congenital malformation of retina: Secondary | ICD-10-CM | POA: Diagnosis not present

## 2022-07-12 DIAGNOSIS — H04123 Dry eye syndrome of bilateral lacrimal glands: Secondary | ICD-10-CM | POA: Diagnosis not present

## 2022-08-29 ENCOUNTER — Other Ambulatory Visit (HOSPITAL_COMMUNITY): Payer: Self-pay

## 2022-08-29 MED ORDER — AMOXICILLIN 500 MG PO CAPS
500.0000 mg | ORAL_CAPSULE | Freq: Three times a day (TID) | ORAL | 0 refills | Status: DC
  Filled 2022-08-29: qty 30, 8d supply, fill #0

## 2022-09-13 ENCOUNTER — Other Ambulatory Visit (HOSPITAL_COMMUNITY): Payer: Self-pay

## 2022-09-13 MED ORDER — AMOXICILLIN-POT CLAVULANATE 875-125 MG PO TABS
1.0000 | ORAL_TABLET | Freq: Two times a day (BID) | ORAL | 0 refills | Status: DC
  Filled 2022-09-13: qty 20, 10d supply, fill #0

## 2022-09-13 MED ORDER — IBUPROFEN 800 MG PO TABS
800.0000 mg | ORAL_TABLET | Freq: Four times a day (QID) | ORAL | 0 refills | Status: AC | PRN
  Filled 2022-09-13: qty 16, 4d supply, fill #0

## 2022-10-09 ENCOUNTER — Other Ambulatory Visit (HOSPITAL_COMMUNITY): Payer: Self-pay

## 2022-10-09 MED ORDER — AMOXICILLIN 500 MG PO CAPS
500.0000 mg | ORAL_CAPSULE | Freq: Four times a day (QID) | ORAL | 0 refills | Status: DC
  Filled 2022-10-09: qty 25, 7d supply, fill #0

## 2022-10-26 ENCOUNTER — Other Ambulatory Visit (HOSPITAL_COMMUNITY): Payer: Self-pay

## 2022-10-26 MED ORDER — AMOXICILLIN-POT CLAVULANATE 875-125 MG PO TABS
1.0000 | ORAL_TABLET | Freq: Two times a day (BID) | ORAL | 0 refills | Status: DC
  Filled 2022-10-26: qty 20, 10d supply, fill #0

## 2023-01-01 ENCOUNTER — Other Ambulatory Visit: Payer: Self-pay | Admitting: Primary Care

## 2023-01-01 DIAGNOSIS — Z0000 Encounter for general adult medical examination without abnormal findings: Secondary | ICD-10-CM

## 2023-01-10 IMAGING — MG MM DIGITAL SCREENING BILAT W/ TOMO AND CAD
6 of 10 series · 6 of 30 positions shown · non-contrast
Comparison: Previous exam(s).

CLINICAL DATA: Screening.

EXAM:
DIGITAL SCREENING BILATERAL MAMMOGRAM WITH TOMOSYNTHESIS AND CAD
TECHNIQUE: Bilateral screening digital craniocaudal and mediolateral oblique
mammograms were obtained. Bilateral screening digital breast
tomosynthesis was performed. The images were evaluated with
computer-aided detection.

[L CC synth-2D]
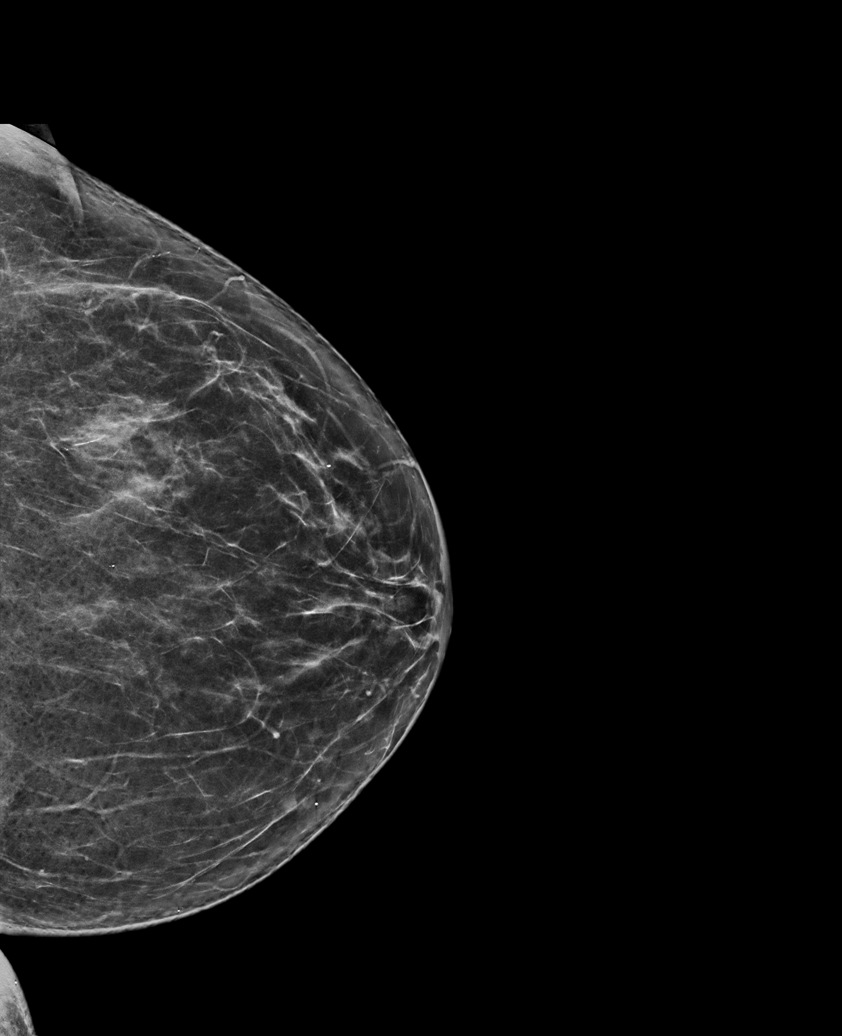

[R MLO synth-2D (1 of 2)]
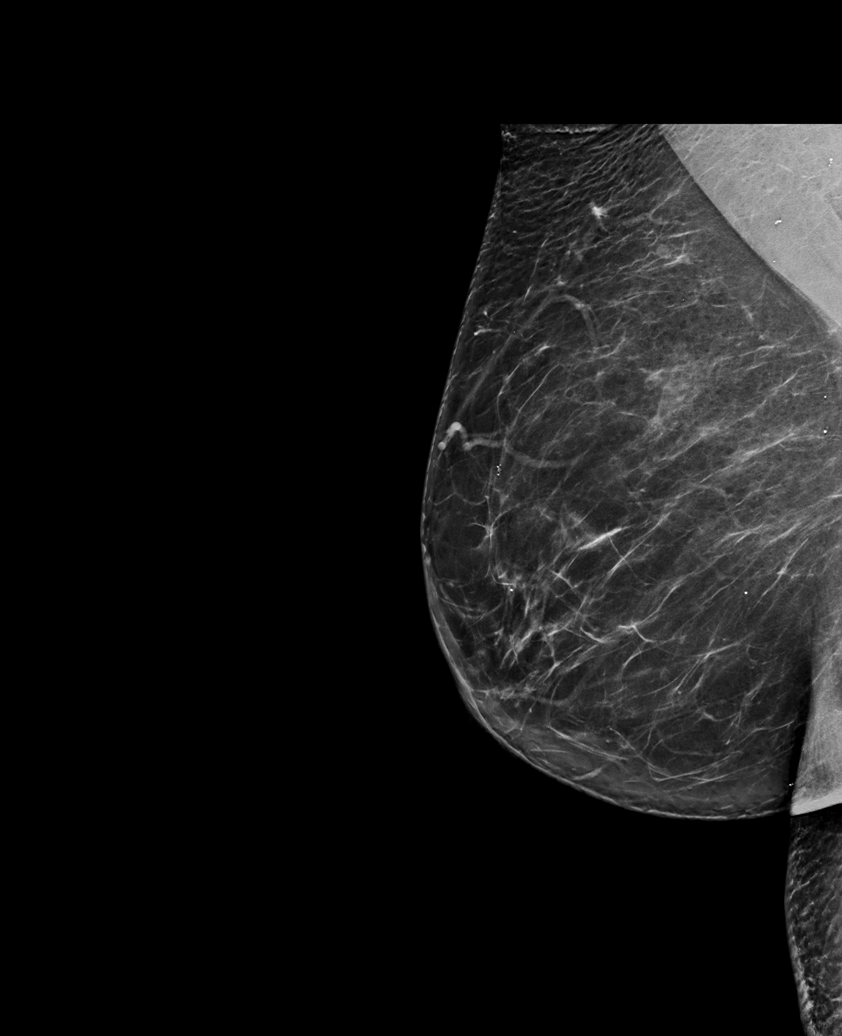

[L MLO synth-2D]
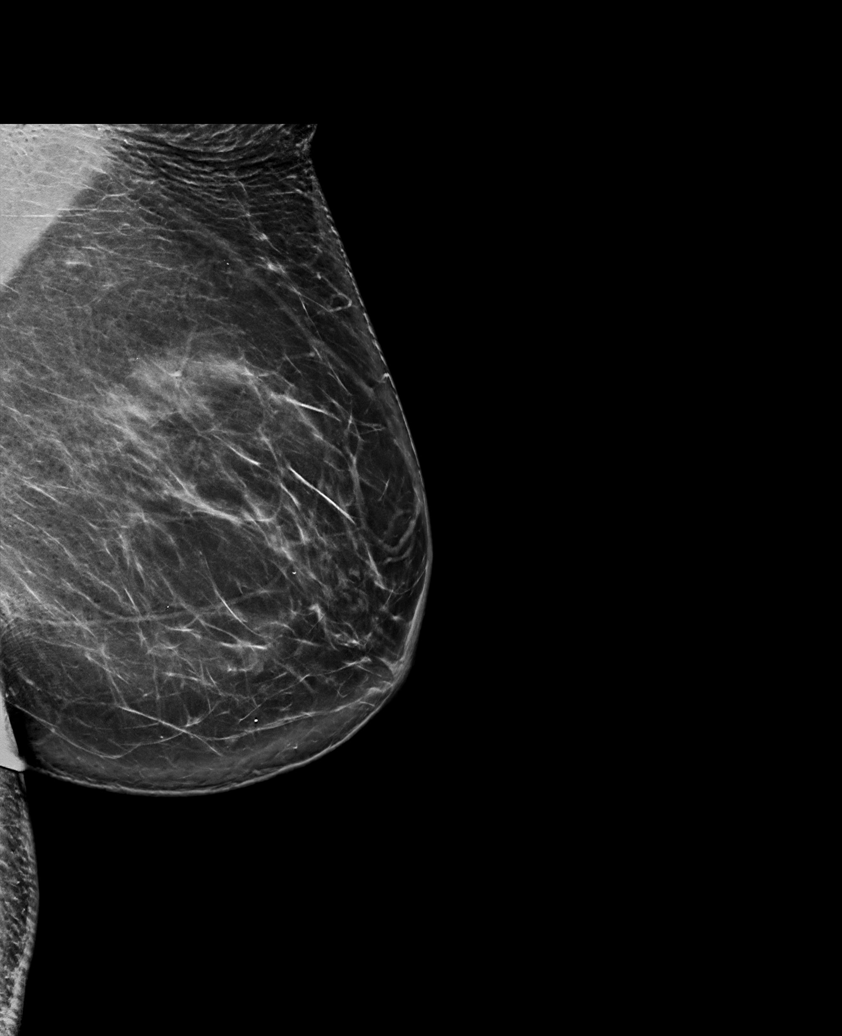

[R MLO synth-2D (2 of 2)]
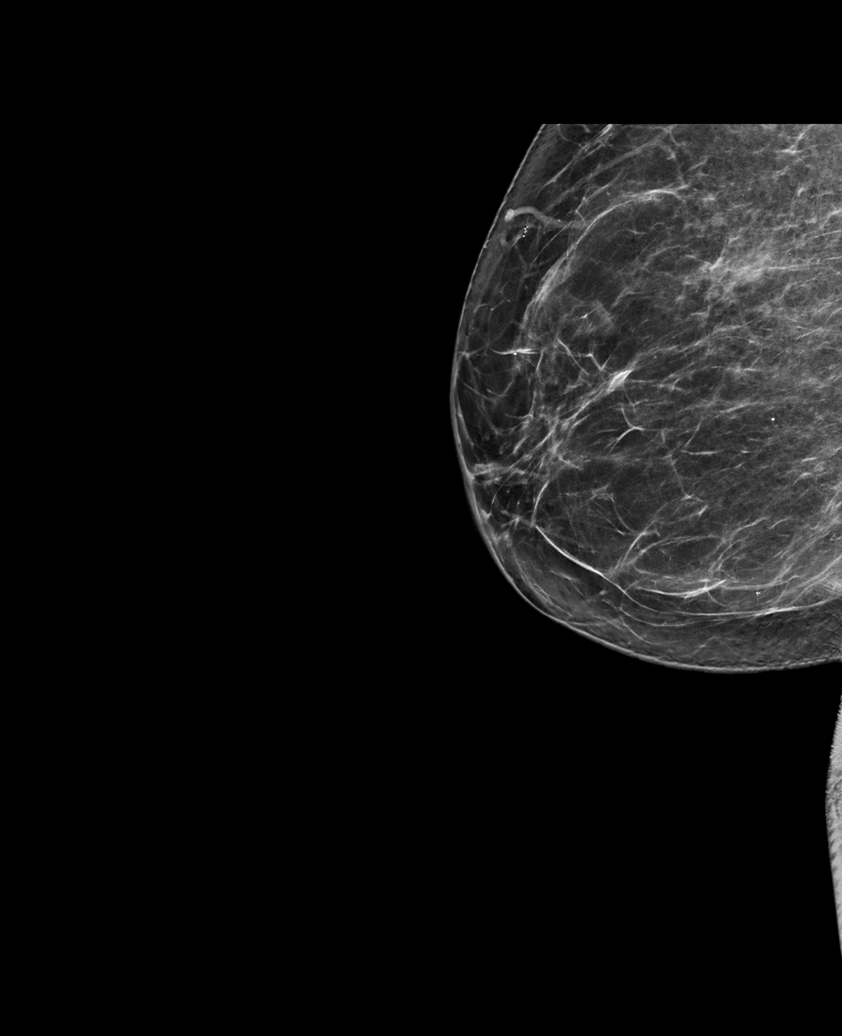

[R CC synth-2D]
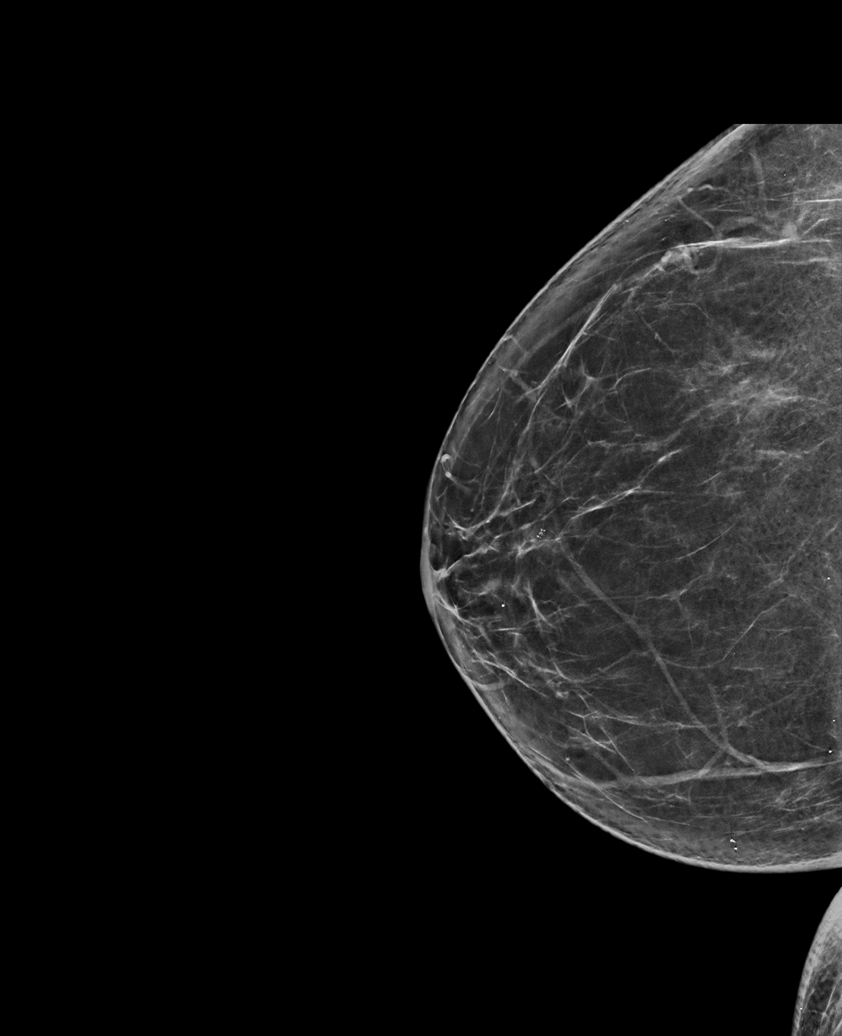

[L CC tomo · tomo slice 36/71.0]
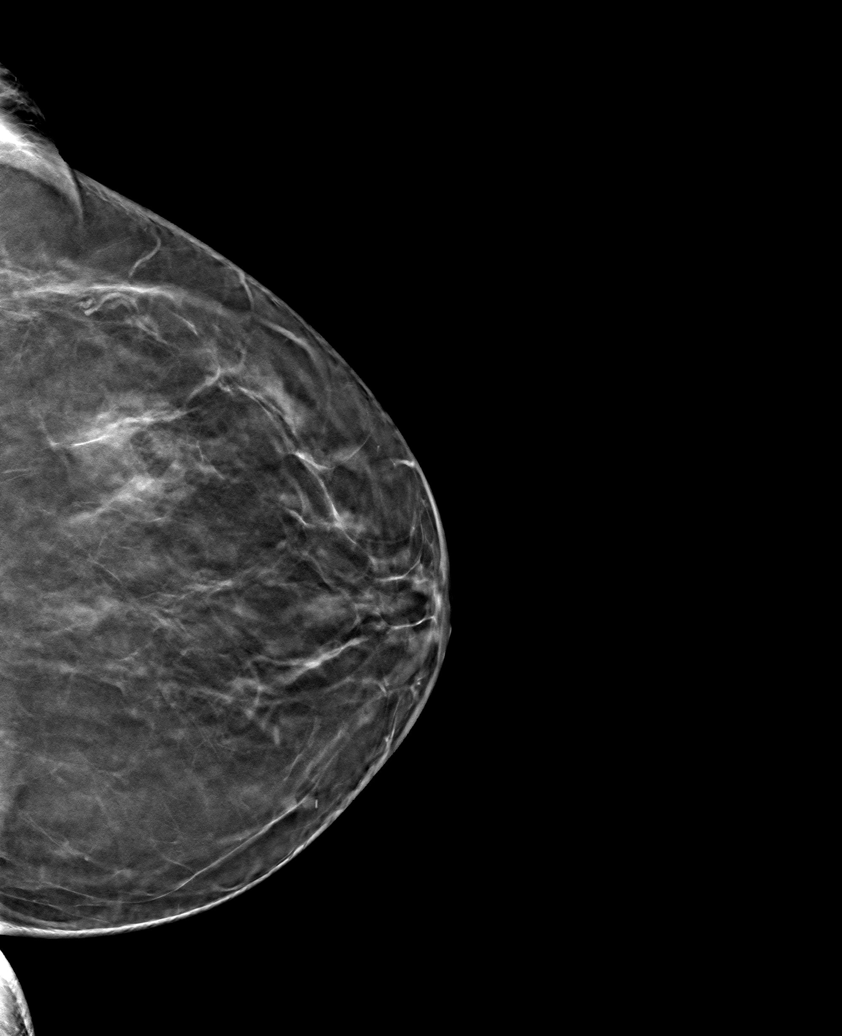

[6 of 30 positions shown; findings below may reference images not displayed]

ACR Breast Density Category b: There are scattered areas of
fibroglandular density.
FINDINGS: There are no findings suspicious for malignancy.
IMPRESSION: No mammographic evidence of malignancy. A result letter of this
screening mammogram will be mailed directly to the patient.

RECOMMENDATION:
Screening mammogram in one year. (Code:51-O-LD2)

BI-RADS CATEGORY  1: Negative.

## 2023-01-17 ENCOUNTER — Ambulatory Visit
Admission: RE | Admit: 2023-01-17 | Discharge: 2023-01-17 | Disposition: A | Discharge status: Home / Self Care | Payer: Commercial Managed Care - PPO | Source: Ambulatory Visit | Attending: Primary Care | Admitting: Primary Care

## 2023-01-17 DIAGNOSIS — Z1231 Encounter for screening mammogram for malignant neoplasm of breast: Secondary | ICD-10-CM | POA: Diagnosis not present

## 2023-01-17 DIAGNOSIS — Z0000 Encounter for general adult medical examination without abnormal findings: Secondary | ICD-10-CM

## 2023-04-18 ENCOUNTER — Other Ambulatory Visit (HOSPITAL_COMMUNITY): Payer: Self-pay

## 2023-04-18 ENCOUNTER — Ambulatory Visit (INDEPENDENT_AMBULATORY_CARE_PROVIDER_SITE_OTHER): Payer: Commercial Managed Care - PPO | Admitting: Primary Care

## 2023-04-18 VITALS — BP 116/68 | HR 59 | Temp 97.1°F | Ht 60.5 in | Wt 188.0 lb

## 2023-04-18 DIAGNOSIS — M8580 Other specified disorders of bone density and structure, unspecified site: Secondary | ICD-10-CM

## 2023-04-18 DIAGNOSIS — Z91030 Bee allergy status: Secondary | ICD-10-CM

## 2023-04-18 DIAGNOSIS — Z0000 Encounter for general adult medical examination without abnormal findings: Secondary | ICD-10-CM | POA: Diagnosis not present

## 2023-04-18 DIAGNOSIS — E2839 Other primary ovarian failure: Secondary | ICD-10-CM

## 2023-04-18 DIAGNOSIS — Z1211 Encounter for screening for malignant neoplasm of colon: Secondary | ICD-10-CM

## 2023-04-18 DIAGNOSIS — E785 Hyperlipidemia, unspecified: Secondary | ICD-10-CM | POA: Diagnosis not present

## 2023-04-18 DIAGNOSIS — R7303 Prediabetes: Secondary | ICD-10-CM

## 2023-04-18 LAB — COMPREHENSIVE METABOLIC PANEL
ALT: 18 U/L (ref 0–35)
AST: 20 U/L (ref 0–37)
Albumin: 3.9 g/dL (ref 3.5–5.2)
Alkaline Phosphatase: 58 U/L (ref 39–117)
BUN: 18 mg/dL (ref 6–23)
CO2: 31 meq/L (ref 19–32)
Calcium: 9.5 mg/dL (ref 8.4–10.5)
Chloride: 104 meq/L (ref 96–112)
Creatinine, Ser: 0.78 mg/dL (ref 0.40–1.20)
GFR: 76.65 mL/min (ref >60.00)
Glucose, Bld: 92 mg/dL (ref 70–99)
Potassium: 4.3 meq/L (ref 3.5–5.1)
Sodium: 143 meq/L (ref 135–145)
Total Bilirubin: 0.6 mg/dL (ref 0.2–1.2)
Total Protein: 7 g/dL (ref 6.0–8.3)

## 2023-04-18 LAB — LIPID PANEL
Cholesterol: 193 mg/dL (ref 0–200)
HDL: 57.8 mg/dL (ref >39.00)
LDL Cholesterol: 118 mg/dL — ABNORMAL HIGH (ref 0–99)
NonHDL: 135.69
Total CHOL/HDL Ratio: 3
Triglycerides: 90 mg/dL (ref 0.0–149.0)
VLDL: 18 mg/dL (ref 0.0–40.0)

## 2023-04-18 LAB — HEMOGLOBIN A1C: Hgb A1c MFr Bld: 6 % (ref 4.6–6.5)

## 2023-04-18 MED ORDER — EPINEPHRINE 0.3 MG/0.3ML IJ SOAJ
0.3000 mg | INTRAMUSCULAR | 0 refills | Status: DC | PRN
  Filled 2023-04-18: qty 2, 25d supply, fill #0

## 2023-04-18 NOTE — Progress Notes (Signed)
Subjective:    Patient ID: Raven Campos, female    DOB: September 15, 1950, 72 y.o.   MRN: [id number]  HPI  Raven Campos is a very pleasant 72 y.o. female who presents today for complete physical and follow up of chronic conditions.  Immunizations: -Tetanus: Completed in 2018 -Influenza: Will get at work -Shingles: Completed Shingrix series -Pneumonia: Completed Prevnra 13 in 2019 and pneumovax in 2022  Diet: Fair diet. Recently switched to a Mediterranean diet Exercise: No regular exercise.  Eye exam: Completes annually  Dental exam: Completes semi-annually    Mammogram: June 2024 Bone Density Scan: 2022, due   Colonoscopy: Completed Cologuard in 2021, due now  BP Readings from Last 3 Encounters:  04/18/23 116/68  03/20/22 134/76  06/18/21 120/73       Review of Systems  Constitutional:  Negative for unexpected weight change.  HENT:  Negative for rhinorrhea.   Respiratory:  Negative for cough and shortness of breath.   Cardiovascular:  Negative for chest pain.  Gastrointestinal:  Negative for constipation and diarrhea.  Genitourinary:  Negative for difficulty urinating and menstrual problem.  Musculoskeletal:  Negative for arthralgias and myalgias.  Skin:  Negative for rash.  Allergic/Immunologic: Negative for environmental allergies.  Neurological:  Negative for dizziness, numbness and headaches.  Psychiatric/Behavioral:  The patient is not nervous/anxious.          Past Medical History:  Diagnosis Date   Hyperlipidemia    Lymphadenopathy 12/15/2019   Obesity    Stress incontinence     Social History   Socioeconomic History   Marital status: Married    Spouse name: Not on file   Number of children: Not on file   Years of education: Not on file   Highest education level: Associate degree: academic program  Occupational History   Not on file  Tobacco Use   Smoking status: Never   Smokeless tobacco: Never  Substance and Sexual Activity   Alcohol  use: Yes    Alcohol/week: 0.0 standard drinks of alcohol    Comment: occ   Drug use: No   Sexual activity: Not on file  Other Topics Concern   Not on file  Social History Narrative   Married.   Highest level of education: Associates degree in Nursing.   Works at [employer].   Enjoys gardening, yard work, sewing, working puzzles.   Social Determinants of Health   Financial Resource Strain: Low Risk  (04/18/2023)   Overall Financial Resource Strain (CARDIA)    Difficulty of Paying Living Expenses: Not hard at all  Food Insecurity: No Food Insecurity (04/18/2023)   Hunger Vital Sign    Worried About Running Out of Food in the Last Year: Never true    Ran Out of Food in the Last Year: Never true  Transportation Needs: No Transportation Needs (04/18/2023)   PRAPARE - Administrator, Civil Service (Medical): No    Lack of Transportation (Non-Medical): No  Physical Activity: Insufficiently Active (04/18/2023)   Exercise Vital Sign    Days of Exercise per Week: 2 days    Minutes of Exercise per Session: 30 min  Stress: Stress Concern Present (04/18/2023)   [employer] of Occupational Health - Occupational Stress Questionnaire    Feeling of Stress : To some extent  Social Connections: Socially Integrated (04/18/2023)   Social Connection and Isolation Panel [NHANES]    Frequency of Communication with Friends and Family: More than three times a week  Frequency of Social Gatherings with Friends and Family: Once a week    Attends Religious Services: 1 to 4 times per year    Active Member of [employer] or Organizations: Yes    Attends Engineer, structural: More than 4 times per year    Marital Status: Married  Catering manager Violence: Not on file    Past Surgical History:  Procedure Laterality Date   CATARACT EXTRACTION     11/30/2021 12/06/2022    Family History  Problem Relation Age of Onset   Arthritis Mother    Hyperlipidemia Mother     Hypertension Mother    Cancer Father        lung   Cancer Other    Obesity Other    Hypertension Other     Allergies  Allergen Reactions   Bee Venom Anaphylaxis    Current Outpatient Medications on File Prior to Visit  Medication Sig Dispense Refill   aspirin 81 MG tablet Take 81 mg by mouth daily.     Calcium Carb-Cholecalciferol 600-800 MG-UNIT TABS      Cholecalciferol (D3 VITAMIN PO) Take 50 mcg by mouth daily.     Cyanocobalamin (VITAMIN B 12 PO) Take 5,000 mg by mouth daily.      fluticasone (FLONASE) 50 MCG/ACT nasal spray Place 2 sprays into both nostrils daily.     ibuprofen (ADVIL) 800 MG tablet Take 1 tablet (800 mg total) by mouth every 6 (six) hours as needed for pain 16 tablet 0   Misc Natural Products (TUMERSAID PO) Take 500 mg by mouth daily.      Red Yeast Rice 600 MG CAPS Take by mouth.     No current facility-administered medications on file prior to visit.    BP 116/68   Pulse (!) 59   Temp (!) 97.1 F (36.2 C) (Temporal)   Ht 5' 0.5" (1.537 m)   Wt 188 lb (85.3 kg)   LMP 08/01/2013   SpO2 97%   BMI 36.11 kg/m  Objective:   Physical Exam HENT:     Right Ear: Tympanic membrane and ear canal normal.     Left Ear: Tympanic membrane and ear canal normal.     Nose: Nose normal.  Eyes:     Conjunctiva/sclera: Conjunctivae normal.     Pupils: Pupils are equal, round, and reactive to light.  Neck:     Thyroid: No thyromegaly.  Cardiovascular:     Rate and Rhythm: Normal rate and regular rhythm.     Heart sounds: No murmur heard. Pulmonary:     Effort: Pulmonary effort is normal.     Breath sounds: Normal breath sounds. No rales.  Abdominal:     General: Bowel sounds are normal.     Palpations: Abdomen is soft.     Tenderness: There is no abdominal tenderness.  Musculoskeletal:        General: Normal range of motion.     Cervical back: Neck supple.  Lymphadenopathy:     Cervical: No cervical adenopathy.  Skin:    General: Skin is warm and  dry.     Findings: No rash.  Neurological:     Mental Status: She is alert and oriented to person, place, and time.     Cranial Nerves: No cranial nerve deficit.     Deep Tendon Reflexes: Reflexes are normal and symmetric.  Psychiatric:        Mood and Affect: Mood normal.  Assessment & Plan:  Preventative health care Assessment & Plan: Immunizations UTD. Will get flu shot at work.  Mammogram UTD. Bone density scan due, orders placed Colon cancer screening due, she continues to opt for Cologuard. Orders placed.  Discussed the importance of a healthy diet and regular exercise in order for weight loss, and to reduce the risk of further co-morbidity.  Exam stable. Labs pending.  Follow up in 1 year for repeat physical.    Allergy to bee sting Assessment & Plan: With recent bee sting. No anaphylaxis.  Refill provided for Epi Pen.  Orders: -     EPINEPHrine; Inject 0.3 mg into the muscle as needed for anaphylaxis.  Dispense: 2 each; Refill: 0  Hyperlipidemia, unspecified hyperlipidemia type Assessment & Plan: Repeat lipid panel pending.  Discussed the importance of a healthy diet and regular exercise in order for weight loss, and to reduce the risk of further co-morbidity. Continue aspirin 81 mg daily.  Orders: -     Lipid panel -     Comprehensive metabolic panel  Prediabetes Assessment & Plan: Repeat A1C pending.  Discussed the importance of a healthy diet and regular exercise in order for weight loss, and to reduce the risk of further co-morbidity.   Orders: -     Hemoglobin A1c -     Comprehensive metabolic panel  Osteopenia, unspecified location Assessment & Plan: Repeat bone density scan due. Orders placed. Continue calcium and vitamin D.   Screening for colon cancer -     Cologuard  Estrogen deficiency -     DG Bone Density; Future        Doreene Nest, NP

## 2023-04-18 NOTE — Assessment & Plan Note (Signed)
Repeat lipid panel pending.  Discussed the importance of a healthy diet and regular exercise in order for weight loss, and to reduce the risk of further co-morbidity. Continue aspirin 81 mg daily.

## 2023-04-18 NOTE — Assessment & Plan Note (Signed)
Repeat bone density scan due. Orders placed. Continue calcium and vitamin D.

## 2023-04-18 NOTE — Assessment & Plan Note (Signed)
Immunizations UTD. Will get flu shot at work.  Mammogram UTD. Bone density scan due, orders placed Colon cancer screening due, she continues to opt for Cologuard. Orders placed.  Discussed the importance of a healthy diet and regular exercise in order for weight loss, and to reduce the risk of further co-morbidity.  Exam stable. Labs pending.  Follow up in 1 year for repeat physical.

## 2023-04-18 NOTE — Assessment & Plan Note (Signed)
With recent bee sting. No anaphylaxis.  Refill provided for Epi Pen.

## 2023-04-18 NOTE — Assessment & Plan Note (Signed)
Repeat A1C pending.  Discussed the importance of a healthy diet and regular exercise in order for weight loss, and to reduce the risk of further co-morbidity.  

## 2023-04-18 NOTE — Patient Instructions (Signed)
Stop by the lab prior to leaving today. I will notify you of your results once received.   Complete the Cologuard once received.  [facility] to [facility] your bone density scan.  It was a pleasure to see you today!

## 2023-05-01 DIAGNOSIS — Z1211 Encounter for screening for malignant neoplasm of colon: Secondary | ICD-10-CM | POA: Diagnosis not present

## 2023-05-06 LAB — COLOGUARD: COLOGUARD: NEGATIVE

## 2023-05-08 ENCOUNTER — Other Ambulatory Visit (HOSPITAL_COMMUNITY): Payer: Self-pay

## 2023-07-25 DIAGNOSIS — Z961 Presence of intraocular lens: Secondary | ICD-10-CM | POA: Diagnosis not present

## 2023-07-25 DIAGNOSIS — Q141 Congenital malformation of retina: Secondary | ICD-10-CM | POA: Diagnosis not present

## 2023-07-25 DIAGNOSIS — H35371 Puckering of macula, right eye: Secondary | ICD-10-CM | POA: Diagnosis not present

## 2023-10-28 ENCOUNTER — Ambulatory Visit
Admission: RE | Admit: 2023-10-28 | Discharge: 2023-10-28 | Disposition: A | Discharge status: Home / Self Care | Payer: Commercial Managed Care - PPO | Source: Ambulatory Visit | Attending: Primary Care | Admitting: Primary Care

## 2023-10-28 DIAGNOSIS — N958 Other specified menopausal and perimenopausal disorders: Secondary | ICD-10-CM | POA: Diagnosis not present

## 2023-10-28 DIAGNOSIS — E2839 Other primary ovarian failure: Secondary | ICD-10-CM

## 2023-10-28 DIAGNOSIS — M8588 Other specified disorders of bone density and structure, other site: Secondary | ICD-10-CM | POA: Diagnosis not present

## 2024-02-27 ENCOUNTER — Other Ambulatory Visit: Payer: Self-pay | Admitting: Primary Care

## 2024-02-27 DIAGNOSIS — Z1231 Encounter for screening mammogram for malignant neoplasm of breast: Secondary | ICD-10-CM

## 2024-03-10 ENCOUNTER — Ambulatory Visit

## 2024-03-11 ENCOUNTER — Ambulatory Visit
Admission: RE | Admit: 2024-03-11 | Discharge: 2024-03-11 | Disposition: A | Discharge status: Home / Self Care | Source: Ambulatory Visit | Attending: Primary Care

## 2024-03-11 DIAGNOSIS — Z1231 Encounter for screening mammogram for malignant neoplasm of breast: Secondary | ICD-10-CM

## 2024-03-16 ENCOUNTER — Ambulatory Visit: Payer: Self-pay | Admitting: Primary Care

## 2024-04-22 ENCOUNTER — Ambulatory Visit: Admitting: Primary Care

## 2024-04-22 ENCOUNTER — Other Ambulatory Visit (HOSPITAL_COMMUNITY): Payer: Self-pay

## 2024-04-22 VITALS — BP 122/60 | HR 80 | Temp 97.4°F | Ht 60.5 in | Wt 185.0 lb

## 2024-04-22 DIAGNOSIS — M8580 Other specified disorders of bone density and structure, unspecified site: Secondary | ICD-10-CM | POA: Diagnosis not present

## 2024-04-22 DIAGNOSIS — Z91030 Bee allergy status: Secondary | ICD-10-CM | POA: Diagnosis not present

## 2024-04-22 DIAGNOSIS — E785 Hyperlipidemia, unspecified: Secondary | ICD-10-CM

## 2024-04-22 DIAGNOSIS — R7303 Prediabetes: Secondary | ICD-10-CM | POA: Diagnosis not present

## 2024-04-22 DIAGNOSIS — Z0000 Encounter for general adult medical examination without abnormal findings: Secondary | ICD-10-CM

## 2024-04-22 LAB — LIPID PANEL
Cholesterol: 198 mg/dL (ref 0–200)
HDL: 52.8 mg/dL (ref >39.00)
LDL Cholesterol: 121 mg/dL — ABNORMAL HIGH (ref 0–99)
NonHDL: 145.62
Total CHOL/HDL Ratio: 4
Triglycerides: 122 mg/dL (ref 0.0–149.0)
VLDL: 24.4 mg/dL (ref 0.0–40.0)

## 2024-04-22 LAB — COMPREHENSIVE METABOLIC PANEL WITH GFR
ALT: 18 U/L (ref 0–35)
AST: 17 U/L (ref 0–37)
Albumin: 4.2 g/dL (ref 3.5–5.2)
Alkaline Phosphatase: 54 U/L (ref 39–117)
BUN: 17 mg/dL (ref 6–23)
CO2: 29 meq/L (ref 19–32)
Calcium: 9.2 mg/dL (ref 8.4–10.5)
Chloride: 106 meq/L (ref 96–112)
Creatinine, Ser: 0.6 mg/dL (ref 0.40–1.20)
GFR: 89.94 mL/min (ref >60.00)
Glucose, Bld: 95 mg/dL (ref 70–99)
Potassium: 4.3 meq/L (ref 3.5–5.1)
Sodium: 143 meq/L (ref 135–145)
Total Bilirubin: 0.6 mg/dL (ref 0.2–1.2)
Total Protein: 6.6 g/dL (ref 6.0–8.3)

## 2024-04-22 LAB — CBC
HCT: 44.8 % (ref 36.0–46.0)
Hemoglobin: 14.8 g/dL (ref 12.0–15.0)
MCHC: 33 g/dL (ref 30.0–36.0)
MCV: 90.3 fl (ref 78.0–100.0)
Platelets: 214 K/uL (ref 150.0–400.0)
RBC: 4.96 Mil/uL (ref 3.87–5.11)
RDW: 13.4 % (ref 11.5–15.5)
WBC: 5.7 K/uL (ref 4.0–10.5)

## 2024-04-22 LAB — HEMOGLOBIN A1C: Hgb A1c MFr Bld: 6.2 % (ref 4.6–6.5)

## 2024-04-22 MED ORDER — EPINEPHRINE 0.3 MG/0.3ML IJ SOAJ
0.3000 mg | INTRAMUSCULAR | 0 refills | Status: AC | PRN
  Filled 2024-04-22: qty 2, 15d supply, fill #0
  Filled 2024-05-20: qty 2, 8d supply, fill #0

## 2024-04-22 NOTE — Assessment & Plan Note (Addendum)
 A1c ordered today, results pending.   Encouraged healthy diet.  I evaluated patient, was consulted regarding treatment, and agree with assessment and plan per [place] Inthavong, MSN, FNP student.   Mallie Gaskins, NP-C

## 2024-04-22 NOTE — Assessment & Plan Note (Addendum)
 Lipid panel ordered today.  Reviewed family history,fortunately no heart attack/stroke history. Considering statin therapy.   Follow up as needed.  I evaluated patient, was consulted regarding treatment, and agree with assessment and plan per Karlis Cregg, MSN, FNP student.   Mallie Gaskins, NP-C

## 2024-04-22 NOTE — Progress Notes (Signed)
   Established Patient Office Visit  Subjective   Patient ID: Raven Campos, female    DOB: May 18, 1951  Age: 73 y.o. MRN: [id number]  No chief complaint on file.   HPI  Mrs. Minella is a 73 year old female with a history of hyperlipidemia and osteopenia here for complete physical and follow up of chronic conditions.  She reports that she is only taking ASA 81 mg. She ran out of red yeast rice and calcium supplements for the last 3 months. Plans to start taking medication on 04/25/2024.  Immunizations: -Tetanus: Completed 09/07/16 -Influenza: October 2nd -Shingles: Completed Shingrix series 04/06/2020 -Pneumonia: Completed 10/11/2020   Diet: Fair diet. Exercise: No regular exercise. States that she works in the yard.  Eye exam: Completes annually  Dental exam: Completes semi-annually    Pap Smear: Completed in Mammogram: Completed in  Bone Density Scan: Completed in  Colonoscopy: Completed in (Cologuard) Lung Cancer Screening: Completed in     Review of Systems  Constitutional: Negative.   HENT: Negative.    Eyes: Negative.   Respiratory: Negative.    Cardiovascular: Negative.   Gastrointestinal: Negative.   Genitourinary: Negative.   Musculoskeletal: Negative.   Skin: Negative.   Neurological: Negative.       Objective:     LMP 08/01/2013    Physical Exam HENT:     Right Ear: Tympanic membrane normal.     Left Ear: Tympanic membrane normal.  Eyes:     Extraocular Movements: Extraocular movements intact.     Pupils: Pupils are equal, round, and reactive to light.  Cardiovascular:     Rate and Rhythm: Normal rate and regular rhythm.  Pulmonary:     Effort: Pulmonary effort is normal.     Breath sounds: Normal breath sounds.  Abdominal:     General: Abdomen is flat. Bowel sounds are normal.     Palpations: Abdomen is soft.  Skin:    General: Skin is warm and dry.     Capillary Refill: Capillary refill takes less than 2 seconds.  Neurological:      Mental Status: She is alert and oriented to person, place, and time.      No results found for any visits on 04/22/24.    The ASCVD Risk score (Arnett DK, et al., 2019) failed to calculate for the following reasons:   The systolic blood pressure is missing    Assessment & Plan:   Problem List Items Addressed This Visit   None   No follow-ups on file.    Decklin Weddington, RN

## 2024-04-22 NOTE — Patient Instructions (Addendum)
 Stop by the lab prior to leaving today. I will notify you of your results once received.   It was a pleasure to see you today!

## 2024-04-22 NOTE — Progress Notes (Signed)
 Subjective:    Patient ID: Raven Campos, female    DOB: Aug 10, 1950, 73 y.o.   MRN: [id number]  Raven Campos is a very pleasant 73 y.o. female who presents today for complete physical and follow up of chronic conditions.  Immunizations: -Tetanus: Completed in 2018 -Influenza: Will complete at work -Shingles: Completed Shingrix series -Pneumonia: Completed Prevnar 13 in 2019 and pneumovax in 2022  Diet: Fair diet.  Exercise: No regular exercise.  Eye exam: Completes annually  Dental exam: Completes semi-annually    Mammogram: Completed in August 2025 Bone Density Scan: Completed in 2025  Colonoscopy: Completed Cologuard in 2024  BP Readings from Last 3 Encounters:  04/22/24 122/60  04/18/23 116/68  03/20/22 134/76       Review of Systems  Constitutional:  Negative for unexpected weight change.  HENT:  Negative for rhinorrhea.   Respiratory:  Negative for cough and shortness of breath.   Cardiovascular:  Negative for chest pain.  Gastrointestinal:  Negative for constipation and diarrhea.  Genitourinary:  Negative for difficulty urinating.  Musculoskeletal:  Negative for arthralgias and myalgias.  Skin:  Negative for rash.  Allergic/Immunologic: Negative for environmental allergies.  Neurological:  Negative for dizziness and headaches.  Psychiatric/Behavioral:  The patient is nervous/anxious.          Past Medical History:  Diagnosis Date   Hyperlipidemia    Lymphadenopathy 12/15/2019   Numbness 01/10/2015   Obesity    Stress incontinence     Social History   Socioeconomic History   Marital status: Married    Spouse name: Not on file   Number of children: Not on file   Years of education: Not on file   Highest education level: Associate degree: occupational, Scientist, product/process development, or vocational program  Occupational History   Not on file  Tobacco Use   Smoking status: Never   Smokeless tobacco: Never  Substance and Sexual Activity   Alcohol use: Yes     Alcohol/week: 0.0 standard drinks of alcohol    Comment: occ   Drug use: No   Sexual activity: Not on file  Other Topics Concern   Not on file  Social History Narrative   Married.   Highest level of education: Associates degree in Nursing.   Works at [employer].   Enjoys gardening, yard work, sewing, working puzzles.   Social Drivers of Corporate investment banker Strain: Low Risk  (04/22/2024)   Overall Financial Resource Strain (CARDIA)    Difficulty of Paying Living Expenses: Not hard at all  Food Insecurity: No Food Insecurity (04/22/2024)   Hunger Vital Sign    Worried About Running Out of Food in the Last Year: Never true    Ran Out of Food in the Last Year: Never true  Transportation Needs: No Transportation Needs (04/22/2024)   PRAPARE - Administrator, Civil Service (Medical): No    Lack of Transportation (Non-Medical): No  Physical Activity: Insufficiently Active (04/22/2024)   Exercise Vital Sign    Days of Exercise per Week: 2 days    Minutes of Exercise per Session: 60 min  Stress: Stress Concern Present (04/22/2024)   [employer] of Occupational Health - Occupational Stress Questionnaire    Feeling of Stress: To some extent  Social Connections: Socially Integrated (04/22/2024)   Social Connection and Isolation Panel    Frequency of Communication with Friends and Family: More than three times a week    Frequency of Social Gatherings  with Friends and Family: Twice a week    Attends Religious Services: More than 4 times per year    Active Member of Clubs or Organizations: Yes    Attends Engineer, structural: More than 4 times per year    Marital Status: Married  Catering manager Violence: Not on file    Past Surgical History:  Procedure Laterality Date   CATARACT EXTRACTION     11/30/2021 12/06/2022    Family History  Problem Relation Age of Onset   Arthritis Mother    Hyperlipidemia Mother    Hypertension Mother     Cancer Father        lung   Cancer Other    Obesity Other    Hypertension Other     Allergies  Allergen Reactions   Bee Venom Anaphylaxis    Current Outpatient Medications on File Prior to Visit  Medication Sig Dispense Refill   aspirin 81 MG tablet Take 81 mg by mouth daily.     Calcium Carb-Cholecalciferol 600-800 MG-UNIT TABS      Cholecalciferol (D3 VITAMIN PO) Take 50 mcg by mouth daily.     Cyanocobalamin (VITAMIN B 12 PO) Take 5,000 mg by mouth daily.      fluticasone  (FLONASE ) 50 MCG/ACT nasal spray Place 2 sprays into both nostrils daily.     ibuprofen  (ADVIL ) 800 MG tablet Take 1 tablet (800 mg total) by mouth every 6 (six) hours as needed for pain 16 tablet 0   Misc Natural Products (TUMERSAID PO) Take 500 mg by mouth daily.      Red Yeast Rice 600 MG CAPS Take by mouth.     No current facility-administered medications on file prior to visit.    BP 122/60   Pulse 80   Temp (!) 97.4 F (36.3 C) (Temporal)   Ht 5' 0.5 (1.537 m)   Wt 185 lb (83.9 kg)   LMP 08/01/2013   SpO2 94%   BMI 35.54 kg/m  Objective:   Physical Exam HENT:     Right Ear: Tympanic membrane and ear canal normal.     Left Ear: Tympanic membrane and ear canal normal.  Eyes:     Pupils: Pupils are equal, round, and reactive to light.  Cardiovascular:     Rate and Rhythm: Normal rate and regular rhythm.  Pulmonary:     Effort: Pulmonary effort is normal.     Breath sounds: Normal breath sounds.  Abdominal:     General: Bowel sounds are normal.     Palpations: Abdomen is soft.     Tenderness: There is no abdominal tenderness.  Musculoskeletal:        General: Normal range of motion.     Cervical back: Neck supple.  Skin:    General: Skin is warm and dry.  Neurological:     Mental Status: She is alert and oriented to person, place, and time.     Cranial Nerves: No cranial nerve deficit.     Deep Tendon Reflexes:     Reflex Scores:      Patellar reflexes are 2+ on the right side  and 2+ on the left side. Psychiatric:        Mood and Affect: Mood normal.     Physical Exam        Assessment & Plan:  Preventative health care Assessment & Plan: Immunizations UTD. Mammogram UTD. Completed Cologuard, due 2027.  Discussed the importance of a healthy diet and regular exercise in order  for weight loss, and to reduce the risk of further co-morbidity.  Exam stable. Labs pending.  Follow up in 1 year for repeat physical.  I evaluated patient, was consulted regarding treatment, and agree with assessment and plan per Allean Simpler, MSN, FNP student.   Mallie Gaskins, NP-C    Prediabetes Assessment & Plan: A1c ordered today, results pending.   Encouraged healthy diet.  I evaluated patient, was consulted regarding treatment, and agree with assessment and plan per Kristin Rudd, MSN, FNP student.   Mallie Gaskins, NP-C   Orders: -     Hemoglobin A1c  Hyperlipidemia, unspecified hyperlipidemia type Assessment & Plan: Lipid panel ordered today.  Reviewed family history,fortunately no heart attack/stroke history. Considering statin therapy.   Follow up as needed.  I evaluated patient, was consulted regarding treatment, and agree with assessment and plan per Kristin Rudd, MSN, FNP student.   Mallie Gaskins, NP-C   Orders: -     Lipid panel -     Comprehensive metabolic panel with GFR -     CBC  Osteopenia, unspecified location Assessment & Plan: Bone density scan UTD. Discussed to resume calcium and vitamin D .  I evaluated patient, was consulted regarding treatment, and agree with assessment and plan per Allean Simpler, MSN, FNP student.   Mallie Gaskins, NP-C    Allergy to bee sting -     EPINEPHrine ; Inject 0.3 mg into the muscle as needed for anaphylaxis.  Dispense: 2 each; Refill: 0    Assessment and Plan Assessment & Plan       Comer MARLA Gaskins, NP History of Present Illness

## 2024-04-22 NOTE — Assessment & Plan Note (Signed)
 Bone density scan UTD. Discussed to resume calcium and vitamin D .  I evaluated patient, was consulted regarding treatment, and agree with assessment and plan per Allean Simpler, MSN, FNP student.   Mallie Gaskins, NP-C

## 2024-04-22 NOTE — Assessment & Plan Note (Addendum)
 Immunizations UTD. Mammogram UTD. Completed Cologuard, due 2027.  Discussed the importance of a healthy diet and regular exercise in order for weight loss, and to reduce the risk of further co-morbidity.  Exam stable. Labs pending.  Follow up in 1 year for repeat physical.  I evaluated patient, was consulted regarding treatment, and agree with assessment and plan per Allean Simpler, MSN, FNP student.   Mallie Gaskins, NP-C

## 2024-04-23 ENCOUNTER — Ambulatory Visit: Payer: Self-pay | Admitting: Primary Care

## 2024-05-01 ENCOUNTER — Other Ambulatory Visit (HOSPITAL_COMMUNITY): Payer: Self-pay

## 2024-05-20 ENCOUNTER — Other Ambulatory Visit (HOSPITAL_COMMUNITY): Payer: Self-pay

## 2024-07-28 DIAGNOSIS — Z961 Presence of intraocular lens: Secondary | ICD-10-CM | POA: Diagnosis not present

## 2024-09-11 ENCOUNTER — Other Ambulatory Visit (HOSPITAL_COMMUNITY): Payer: Self-pay | Admitting: Cardiology

## 2024-09-11 DIAGNOSIS — Z8249 Family history of ischemic heart disease and other diseases of the circulatory system: Secondary | ICD-10-CM

## 2024-09-24 ENCOUNTER — Other Ambulatory Visit (HOSPITAL_COMMUNITY)
